# Patient Record
Sex: Male | Born: 1985
Health system: Southern US, Community
[De-identification: ages and names within clinical notes are randomized; demographics above are authoritative.]

---
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
## 2011-05-24 ENCOUNTER — Encounter (HOSPITAL_COMMUNITY): Payer: Self-pay | Admitting: Emergency Medicine

## 2011-05-24 ENCOUNTER — Emergency Department (HOSPITAL_COMMUNITY)
Admission: EM | Admit: 2011-05-24 | Discharge: 2011-05-24 | Disposition: A | Discharge status: Home / Self Care | Payer: 59 | Attending: Emergency Medicine | Admitting: Emergency Medicine

## 2011-05-24 DIAGNOSIS — M542 Cervicalgia: Secondary | ICD-10-CM | POA: Insufficient documentation

## 2011-05-24 DIAGNOSIS — R061 Stridor: Secondary | ICD-10-CM | POA: Insufficient documentation

## 2011-05-24 DIAGNOSIS — R51 Headache: Secondary | ICD-10-CM | POA: Insufficient documentation

## 2011-05-24 DIAGNOSIS — K0889 Other specified disorders of teeth and supporting structures: Secondary | ICD-10-CM

## 2011-05-24 DIAGNOSIS — K089 Disorder of teeth and supporting structures, unspecified: Secondary | ICD-10-CM | POA: Insufficient documentation

## 2011-05-24 MED ORDER — DEXAMETHASONE SODIUM PHOSPHATE 10 MG/ML IJ SOLN
10.0000 mg | Freq: Once | INTRAMUSCULAR | Status: AC
  Administered 2011-05-24: 10 mg via INTRAMUSCULAR
  Filled 2011-05-24: qty 1

## 2011-05-24 MED ORDER — HYDROMORPHONE HCL PF 1 MG/ML IJ SOLN
1.0000 mg | Freq: Once | INTRAMUSCULAR | Status: AC
  Administered 2011-05-24: 1 mg via INTRAMUSCULAR
  Filled 2011-05-24: qty 1

## 2011-05-24 NOTE — ED Notes (Signed)
Pt c/o mouth pain post tooth extraction on Thursday that is unrelieved by rx'd ketorolac and vicodin.

## 2011-05-24 NOTE — ED Provider Notes (Signed)
History     CSN: [id number]  Arrival date & time 05/24/11  0133   First MD Initiated Contact with Patient 05/24/11 [phone]      Chief Complaint  Patient presents with  . Dental Pain    post tooth extraction     HPI  History provided by the patient. Patient reports having right lower wisdom tooth extraction last Thursday. Patient reports having some continued pain and soreness. Tonight pain was much more severe and radiated to the right side of the face. Patient also reports some soreness to the right neck. Patient has taken his home hydrocodone pain medicine without significant relief. Patient denies having any associated fever, chills, sweats, nausea or vomiting. Patient did have nausea yesterday but has been eating and drinking normally. Symptoms are described as severe. Patient denies any other aggravating or alleviating factors.    History reviewed. No pertinent past medical history.  History reviewed. No pertinent past surgical history.  No family history on file.  History  Substance Use Topics  . Smoking status: Former Games developer  . Smokeless tobacco: Not on file  . Alcohol Use: No      Review of Systems  Constitutional: Negative for fever and chills.  HENT: Positive for dental problem. Negative for sore throat.   Respiratory: Negative for cough and shortness of breath.   Gastrointestinal: Negative for vomiting and abdominal pain.  Neurological: Positive for headaches. Negative for weakness and numbness.    Allergies  Review of patient's allergies indicates no known allergies.  Home Medications   Current Outpatient Rx  Name Route Sig Dispense Refill  . HYDROCODONE-ACETAMINOPHEN 7.5-325 MG PO TABS Oral Take 1 tablet by mouth every 6 (six) hours as needed.    Marland Kitchen KETOROLAC TROMETHAMINE 10 MG PO TABS Oral Take 10 mg by mouth every 6 (six) hours as needed.      BP 133/87  Pulse 61  Temp(Src) 98.5 F (36.9 C) (Oral)  Resp 18  SpO2 100%  Physical Exam  Nursing  note and vitals reviewed. Constitutional: He is oriented to person, place, and time. He appears well-developed and well-nourished. No distress.  HENT:  Head: Normocephalic and atraumatic.       Evidence of recent traction to right lower wisdom tooth. Suture comes in place and intact. No bleeding or swelling. There is tenderness to the area in jaw. No trismus. No swelling of the tongue.  Eyes: Conjunctivae and EOM are normal. Pupils are equal, round, and reactive to light.  Cardiovascular: Normal rate and regular rhythm.   Pulmonary/Chest: Effort normal and breath sounds normal. Stridor present.  Abdominal: Soft. He exhibits no distension. There is no tenderness.  Lymphadenopathy:    He has no cervical adenopathy.  Neurological: He is alert and oriented to person, place, and time. No cranial nerve deficit.  Skin: Skin is warm. No rash noted.  Psychiatric: He has a normal mood and affect. His behavior is normal.    ED Course  Procedures       1. Pain, dental       MDM  Patient seen and evaluated. Patient no acute distress. he is sleeping upon arrival to room. Evidence of Rt lower wisdom tooth extraction.  Pt was seen by Dr. Leretha Pol, PA 05/24/11 [phone]

## 2011-05-24 NOTE — ED Notes (Signed)
Pt c/o pain to R jaw, pt states he molar removed 2 days ago, now pain worse

## 2011-05-24 NOTE — ED Provider Notes (Signed)
Medical screening examination/treatment/procedure(s) were performed by non-physician practitioner and as supervising physician I was immediately available for consultation/collaboration.  Jasmine Awe, MD 05/24/11 2318

## 2011-05-24 NOTE — Discharge Instructions (Signed)
Please follow up with your dentist and oral surgeon on Monday.  Return for worsening symptoms, fever, chills, nausea or vomiting.  Dental Pain A tooth ache may be caused by cavities (tooth decay). Cavities expose the nerve of the tooth to air and hot or cold temperatures. It may come from an infection or abscess (also called a boil or furuncle) around your tooth. It is also often caused by dental caries (tooth decay). This causes the pain you are having. DIAGNOSIS  Your caregiver can diagnose this problem by exam. TREATMENT   If caused by an infection, it may be treated with medications which kill germs (antibiotics) and pain medications as prescribed by your caregiver. Take medications as directed.   Only take over-the-counter or prescription medicines for pain, discomfort, or fever as directed by your caregiver.   Whether the tooth ache today is caused by infection or dental disease, you should see your dentist as soon as possible for further care.  SEEK MEDICAL CARE IF: The exam and treatment you received today has been provided on an emergency basis only. This is not a substitute for complete medical or dental care. If your problem worsens or new problems (symptoms) appear, and you are unable to meet with your dentist, call or return to this location. SEEK IMMEDIATE MEDICAL CARE IF:   You have a fever.   You develop redness and swelling of your face, jaw, or neck.   You are unable to open your mouth.   You have severe pain uncontrolled by pain medicine.  MAKE SURE YOU:   Understand these instructions.   Will watch your condition.   Will get help right away if you are not doing well or get worse.  Document Released: 01/26/2005 Document Revised: 01/15/2011 Document Reviewed: 09/14/2007 [facility] Patient Information 2012 [place], [place].

## 2011-05-24 NOTE — ED Notes (Signed)
Pt experiencing pain to R face, jaw and back of head since tooth extraction 2 days ago. Nausea yesterday

## 2011-10-03 ENCOUNTER — Emergency Department (HOSPITAL_COMMUNITY): Payer: 59

## 2011-10-03 ENCOUNTER — Encounter (HOSPITAL_COMMUNITY): Payer: Self-pay | Admitting: Family Medicine

## 2011-10-03 ENCOUNTER — Emergency Department (HOSPITAL_COMMUNITY)
Admission: EM | Admit: 2011-10-03 | Discharge: 2011-10-03 | Disposition: A | Discharge status: Home / Self Care | Payer: 59 | Attending: Emergency Medicine | Admitting: Emergency Medicine

## 2011-10-03 DIAGNOSIS — M545 Low back pain, unspecified: Secondary | ICD-10-CM | POA: Insufficient documentation

## 2011-10-03 DIAGNOSIS — M79609 Pain in unspecified limb: Secondary | ICD-10-CM | POA: Insufficient documentation

## 2011-10-03 DIAGNOSIS — IMO0001 Reserved for inherently not codable concepts without codable children: Secondary | ICD-10-CM | POA: Insufficient documentation

## 2011-10-03 DIAGNOSIS — M25539 Pain in unspecified wrist: Secondary | ICD-10-CM | POA: Insufficient documentation

## 2011-10-03 DIAGNOSIS — Z87891 Personal history of nicotine dependence: Secondary | ICD-10-CM | POA: Insufficient documentation

## 2011-10-03 MED ORDER — IBUPROFEN 400 MG PO TABS
800.0000 mg | ORAL_TABLET | Freq: Once | ORAL | Status: AC
  Administered 2011-10-03: 800 mg via ORAL
  Filled 2011-10-03: qty 2
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
## 2011-10-03 NOTE — ED Notes (Signed)
Patient transported to X-ray 
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
## 2012-01-05 ENCOUNTER — Emergency Department (HOSPITAL_COMMUNITY): Payer: Worker's Compensation

## 2012-01-05 ENCOUNTER — Emergency Department (HOSPITAL_COMMUNITY)
Admission: EM | Admit: 2012-01-05 | Discharge: 2012-01-06 | Disposition: A | Discharge status: Home / Self Care | Payer: Worker's Compensation | Attending: Emergency Medicine | Admitting: Emergency Medicine

## 2012-01-05 DIAGNOSIS — S6010XA Contusion of unspecified finger with damage to nail, initial encounter: Secondary | ICD-10-CM

## 2012-01-05 DIAGNOSIS — S62639A Displaced fracture of distal phalanx of unspecified finger, initial encounter for closed fracture: Secondary | ICD-10-CM

## 2012-01-05 DIAGNOSIS — Z87891 Personal history of nicotine dependence: Secondary | ICD-10-CM | POA: Insufficient documentation

## 2012-01-05 DIAGNOSIS — S6000XA Contusion of unspecified finger without damage to nail, initial encounter: Secondary | ICD-10-CM | POA: Insufficient documentation

## 2012-01-05 DIAGNOSIS — Y9389 Activity, other specified: Secondary | ICD-10-CM | POA: Insufficient documentation

## 2012-01-05 DIAGNOSIS — W230XXA Caught, crushed, jammed, or pinched between moving objects, initial encounter: Secondary | ICD-10-CM | POA: Insufficient documentation

## 2012-01-05 DIAGNOSIS — Y929 Unspecified place or not applicable: Secondary | ICD-10-CM | POA: Insufficient documentation

## 2012-01-05 NOTE — ED Provider Notes (Signed)
History     CSN: [id number]  Arrival date & time 01/05/12  2316   First MD Initiated Contact with Patient 01/05/12 2317      Chief Complaint  Patient presents with  . Finger Injury    (Consider location/radiation/quality/duration/timing/severity/associated sxs/prior treatment) HPI History provided by patient.  At work around 1 AM last night and smashed his finger between a steel cart and heavy box sustaining injury to his distal left fourth finger and somewhat to his distal left fifth finger. No abrasion or laceration or bleeding. Today he has pain in that area with hematoma under his left fourth nailbed.  He has difficulty flexing his distal left fourth digit at the PIP but no other weakness or numbness otherwise. Pain is sharp in quality nonradiating. Moderate in severity. He declines any pain medications at this time. No other pain injury or trauma. No past medical history on file.  No past surgical history on file.  No family history on file.  History  Substance Use Topics  . Smoking status: Former Games developer  . Smokeless tobacco: Not on file  . Alcohol Use: No      Review of Systems  Constitutional: Negative for fever and chills.  Respiratory: Negative for shortness of breath.   Cardiovascular: Negative for chest pain.  Gastrointestinal: Negative for abdominal pain.  Musculoskeletal: Negative for back pain.  Skin: Negative for rash.  Neurological: Negative for headaches.  All other systems reviewed and are negative.    Allergies  Review of patient's allergies indicates no known allergies.  Home Medications   Current Outpatient Rx  Name  Route  Sig  Dispense  Refill  . IBUPROFEN 200 MG PO TABS   Oral   Take 400 mg by mouth every 6 (six) hours as needed. For pain           BP 133/87  Pulse 55  Temp 97.8 F (36.6 C) (Oral)  Resp 16  SpO2 98%  Physical Exam  Constitutional: He is oriented to person, place, and time. He appears well-developed and  well-nourished.  HENT:  Head: Normocephalic and atraumatic.  Eyes: EOM are normal. Pupils are equal, round, and reactive to light.  Neck: Neck supple.  Cardiovascular: Regular rhythm and intact distal pulses.   Pulmonary/Chest: Effort normal. No respiratory distress.  Musculoskeletal:       Left hand with tenderness over the DIP and distal digit left fourth finger with complete subungual hematoma. Decreased range of motion at the DIPs the patient relates secondary to pain. No obvious deformity. Skin intact throughout. Mild tenderness over the distal left fifth digit without subungual hematoma. Without deformity. No pain or tenderness otherwise  Neurological: He is alert and oriented to person, place, and time.  Skin: Skin is warm and dry.    ED Course  NERVE BLOCK Date/Time: 01/06/2012 1:25 AM Performed by: Sunnie Nielsen Authorized by: Sunnie Nielsen Consent: Verbal consent obtained. Risks and benefits: risks, benefits and alternatives were discussed Consent given by: patient Patient understanding: patient states understanding of the procedure being performed Patient consent: the patient's understanding of the procedure matches consent given Procedure consent: procedure consent matches procedure scheduled Required items: required blood products, implants, devices, and special equipment available Patient identity confirmed: verbally with patient Time out: Immediately prior to procedure a "time out" was called to verify the correct patient, procedure, equipment, support staff and site/side marked as required. Indications comments: Sub-ungual hematoma evacuation and pain relief Nerve block body site: Fourth digit. Laterality: left Preparation:  Patient was prepped and draped in the usual sterile fashion. Patient position: sitting Needle gauge: 25 G Location technique: anatomical landmarks Local anesthetic: lidocaine 1% without epinephrine Anesthetic total: 2 ml Outcome: pain  improved Patient tolerance: Patient tolerated the procedure well with no immediate complications.  PROCEDURE:  SUBUNGUAL HEMATOMA EVACUATION. 1:27 AM  Consent obtained. Timeout performed. After digital block as above, 18-gauge needle used to drain nail of left fourth digit. Blood evacuated and the patient tolerated this well.  PROCEDURE: Splint application.1:28 AM Consent obtained. Timeout performed. After digital block and subungual hematoma evacuation as above, finger splint was placed to left fourth digit indication distal tuft fracture. Fourth and fifth digits were taped together. Splint precautions provided and stated as understood. Patient tolerated this well.   Dg Hand Complete Left  01/06/2012  *RADIOLOGY REPORT*  Clinical Data: Pain in the fourth and fifth distal fingers related to the recent traumatic injury.  LEFT HAND - COMPLETE 3+ VIEW  Comparison: 01/05/2012 at 06:23 p.m.  Findings: Three views of the left hand again demonstrate a subtle nondisplaced tuft fracture through the distal phalanx of the fourth digit.  No other acute displaced fracture, subluxation or dislocation is noted.  IMPRESSION: 1.  Nondisplaced tuft fracture of the fourth distal phalanx redemonstrated.   Original Report Authenticated By: Trudie Reed, M.D.    Ice. Declines pain meds. X-ray obtained and reviewed as above. Evacuation of left fourth subungual hematoma.  Hand referral. Keflex provided. This is now an open wound. Pain medications provided. Referral to occupational medicine as needed for return to work. MDM   Left fourth distal tuft fracture after crush injury at work. Has associated subungual hematoma drained as above. Splint provided. X-rays reviewed as above. Hand referral. Occupational med referral. Vital signs and nursing notes reviewed. Patient released back to work with limitations -no use of left hand until cleared by Dr. Merlyn Lot on call for hand       Sunnie Nielsen, MD 01/06/12 [phone]

## 2012-01-05 NOTE — ED Notes (Signed)
Pt got 4th and 5th fingers on L hand caught between some steel and a box. Injury worse to 4th finger on L hand. Injury happened at 0100 this morning. Pt has bruising and swelling 4th finger on L hand. ROM intact.

## 2012-01-06 MED ORDER — IBUPROFEN 800 MG PO TABS: 800.0000 mg | ORAL_TABLET | Freq: Three times a day (TID) | ORAL | Status: DC

## 2012-01-06 MED ORDER — CEPHALEXIN 500 MG PO CAPS: 500.0000 mg | ORAL_CAPSULE | Freq: Four times a day (QID) | ORAL | Status: DC

## 2012-01-06 NOTE — ED Notes (Signed)
Ice pack applied to L hand

## 2012-07-27 ENCOUNTER — Emergency Department (HOSPITAL_COMMUNITY)
Admission: EM | Admit: 2012-07-27 | Discharge: 2012-07-27 | Disposition: A | Discharge status: Home / Self Care | Payer: No Typology Code available for payment source | Attending: Emergency Medicine | Admitting: Emergency Medicine

## 2012-07-27 DIAGNOSIS — S8990XA Unspecified injury of unspecified lower leg, initial encounter: Secondary | ICD-10-CM | POA: Insufficient documentation

## 2012-07-27 DIAGNOSIS — Y92410 Unspecified street and highway as the place of occurrence of the external cause: Secondary | ICD-10-CM | POA: Diagnosis not present

## 2012-07-27 DIAGNOSIS — S59919A Unspecified injury of unspecified forearm, initial encounter: Secondary | ICD-10-CM | POA: Diagnosis not present

## 2012-07-27 DIAGNOSIS — S99929A Unspecified injury of unspecified foot, initial encounter: Secondary | ICD-10-CM | POA: Insufficient documentation

## 2012-07-27 DIAGNOSIS — S0990XA Unspecified injury of head, initial encounter: Secondary | ICD-10-CM | POA: Diagnosis present

## 2012-07-27 DIAGNOSIS — Z87891 Personal history of nicotine dependence: Secondary | ICD-10-CM | POA: Insufficient documentation

## 2012-07-27 DIAGNOSIS — Y9389 Activity, other specified: Secondary | ICD-10-CM | POA: Insufficient documentation

## 2012-07-27 DIAGNOSIS — S59909A Unspecified injury of unspecified elbow, initial encounter: Secondary | ICD-10-CM | POA: Insufficient documentation

## 2012-07-27 DIAGNOSIS — S6990XA Unspecified injury of unspecified wrist, hand and finger(s), initial encounter: Secondary | ICD-10-CM | POA: Insufficient documentation

## 2012-07-27 MED ORDER — NAPROXEN 500 MG PO TABS: 500.0000 mg | ORAL_TABLET | Freq: Two times a day (BID) | ORAL | Status: DC

## 2012-07-27 MED ORDER — HYDROCODONE-ACETAMINOPHEN 5-325 MG PO TABS: 2.0000 | ORAL_TABLET | ORAL | Status: DC | PRN
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
## 2012-07-28 NOTE — ED Provider Notes (Signed)
Medical screening examination/treatment/procedure(s) were performed by non-physician practitioner and as supervising physician I was immediately available for consultation/collaboration.    Vida Roller, MD 07/28/12 [phone]

## 2012-09-13 ENCOUNTER — Emergency Department (HOSPITAL_COMMUNITY)
Admission: EM | Admit: 2012-09-13 | Discharge: 2012-09-14 | Disposition: A | Discharge status: Home / Self Care | Payer: BC Managed Care – PPO | Attending: Emergency Medicine | Admitting: Emergency Medicine

## 2012-09-13 ENCOUNTER — Encounter (HOSPITAL_COMMUNITY): Payer: Self-pay

## 2012-09-13 DIAGNOSIS — Y929 Unspecified place or not applicable: Secondary | ICD-10-CM | POA: Insufficient documentation

## 2012-09-13 DIAGNOSIS — Z87891 Personal history of nicotine dependence: Secondary | ICD-10-CM | POA: Insufficient documentation

## 2012-09-13 DIAGNOSIS — T6391XA Toxic effect of contact with unspecified venomous animal, accidental (unintentional), initial encounter: Secondary | ICD-10-CM | POA: Insufficient documentation

## 2012-09-13 DIAGNOSIS — R220 Localized swelling, mass and lump, head: Secondary | ICD-10-CM | POA: Insufficient documentation

## 2012-09-13 DIAGNOSIS — Y9389 Activity, other specified: Secondary | ICD-10-CM | POA: Insufficient documentation

## 2012-09-13 DIAGNOSIS — IMO0002 Reserved for concepts with insufficient information to code with codable children: Secondary | ICD-10-CM | POA: Insufficient documentation

## 2012-09-13 DIAGNOSIS — T63461A Toxic effect of venom of wasps, accidental (unintentional), initial encounter: Secondary | ICD-10-CM | POA: Insufficient documentation

## 2012-09-13 DIAGNOSIS — H5789 Other specified disorders of eye and adnexa: Secondary | ICD-10-CM | POA: Insufficient documentation

## 2012-09-13 DIAGNOSIS — Z79899 Other long term (current) drug therapy: Secondary | ICD-10-CM | POA: Insufficient documentation

## 2012-09-13 MED ORDER — FAMOTIDINE IN NACL 20-0.9 MG/50ML-% IV SOLN
20.0000 mg | Freq: Once | INTRAVENOUS | Status: AC
  Administered 2012-09-13: 20 mg via INTRAVENOUS
  Filled 2012-09-13: qty 50

## 2012-09-13 MED ORDER — DIPHENHYDRAMINE HCL 50 MG/ML IJ SOLN
25.0000 mg | Freq: Once | INTRAMUSCULAR | Status: AC
  Administered 2012-09-13: 50 mg via INTRAVENOUS
  Filled 2012-09-13: qty 1

## 2012-09-13 MED ORDER — SODIUM CHLORIDE 0.9 % IV BOLUS (SEPSIS)
1000.0000 mL | Freq: Once | INTRAVENOUS | Status: AC
  Administered 2012-09-13: 1000 mL via INTRAVENOUS

## 2012-09-13 MED ORDER — CEPHALEXIN 500 MG PO CAPS
500.0000 mg | ORAL_CAPSULE | Freq: Once | ORAL | Status: AC
  Administered 2012-09-13: 500 mg via ORAL
  Filled 2012-09-13: qty 1

## 2012-09-13 MED ORDER — METHYLPREDNISOLONE SODIUM SUCC 125 MG IJ SOLR
125.0000 mg | Freq: Once | INTRAMUSCULAR | Status: AC
  Administered 2012-09-13: 125 mg via INTRAVENOUS
  Filled 2012-09-13: qty 2

## 2012-09-13 NOTE — ED Notes (Signed)
Pt was stung by a bee this morning in his face, left side of face and eye are swollen

## 2012-09-13 NOTE — ED Provider Notes (Signed)
CSN: [id number]     Arrival date & time 09/13/12  2253 History     First MD Initiated Contact with Patient 09/13/12 2303     Chief Complaint  Patient presents with  . Insect Bite   (Consider location/radiation/quality/duration/timing/severity/associated sxs/prior Treatment) The history is provided by the patient and medical records. No language interpreter was used.    Stephen Hoffman is a 27 y.o. male  with no known medical Hx presents to the Emergency Department complaining of gradual, persistent, progressively worsening swelling of the L eye after being stung by a bee at 8:30am on the cheek.  Pt has not hx of allergic reaction to bees and denies wheezing, SOB, hives, stridor or difficulty breathing, talking or swallowing. Associated symptoms include pain at the site of the sting. Pt has taken 25mg  of benadryl without relief.  Nothing makes it better and nothing  makes it worse.  Pt denies fever, chills, headache, neck pain, chest pain, SOB, abd pain, N/V/D weakness, dizziness, syncope.      History reviewed. No pertinent past medical history. History reviewed. No pertinent past surgical history. History reviewed. No pertinent family history. History  Substance Use Topics  . Smoking status: Former Games developer  . Smokeless tobacco: Not on file  . Alcohol Use: No    Review of Systems  Constitutional: Negative for fever, diaphoresis, appetite change, fatigue and unexpected weight change.  HENT: Positive for facial swelling. Negative for mouth sores and neck stiffness.   Eyes: Negative for visual disturbance.  Respiratory: Negative for cough, chest tightness, shortness of breath and wheezing.   Cardiovascular: Negative for chest pain.  Gastrointestinal: Negative for nausea, vomiting, abdominal pain, diarrhea and constipation.  Endocrine: Negative for polydipsia, polyphagia and polyuria.  Genitourinary: Negative for dysuria, urgency, frequency and hematuria.  Musculoskeletal: Negative for  back pain.  Skin: Negative for rash.  Allergic/Immunologic: Negative for immunocompromised state.  Neurological: Negative for syncope, light-headedness and headaches.  Hematological: Does not bruise/bleed easily.  Psychiatric/Behavioral: Negative for sleep disturbance. The patient is not nervous/anxious.     Allergies  Review of patient's allergies indicates no known allergies.  Home Medications   Current Outpatient Rx  Name  Route  Sig  Dispense  Refill  . cetirizine (ZYRTEC) 10 MG tablet   Oral   Take 10 mg by mouth every morning.          Marland Kitchen esomeprazole (NEXIUM) 40 MG capsule   Oral   Take 40 mg by mouth daily before breakfast.         . Multiple Vitamin (MULTIVITAMIN WITH MINERALS) TABS tablet   Oral   Take 1 tablet by mouth every morning.         . cephALEXin (KEFLEX) 500 MG capsule   Oral   Take 1 capsule (500 mg total) by mouth 4 (four) times daily.   40 capsule   0   . diphenhydrAMINE (BENADRYL) 25 MG tablet   Oral   Take 1 tablet (25 mg total) by mouth every 6 (six) hours as needed for itching (Rash).   30 tablet   0   . famotidine (PEPCID) 20 MG tablet   Oral   Take 1 tablet (20 mg total) by mouth 2 (two) times daily.   10 tablet   0   . predniSONE (DELTASONE) 20 MG tablet   Oral   Take 2 tablets (40 mg total) by mouth daily.   10 tablet   0    BP 125/62  Pulse  75  Temp(Src) 98.5 F (36.9 C) (Oral)  Resp 18  Ht 6\' 2"  (1.88 m)  Wt 255 lb (115.667 kg)  BMI 32.73 kg/m2  SpO2 100% Physical Exam  Nursing note and vitals reviewed. Constitutional: He appears well-developed and well-nourished. No distress.  Awake, alert, nontoxic appearance  HENT:  Head: Normocephalic and atraumatic.    Right Ear: Hearing, tympanic membrane, external ear and ear canal normal.  Nose: Nose normal. No mucosal edema. Right sinus exhibits no maxillary sinus tenderness and no frontal sinus tenderness. Left sinus exhibits no maxillary sinus tenderness and no  frontal sinus tenderness.  Mouth/Throat: Uvula is midline, oropharynx is clear and moist and mucous membranes are normal. Mucous membranes are not dry. No oropharyngeal exudate, posterior oropharyngeal edema, posterior oropharyngeal erythema or tonsillar abscesses.  Facial swelling of the Left cheek and eye Mild erythema of the left cheek at the site of bee sting. Stinger removed from the patient's face.  Eyes: Conjunctivae and EOM are normal. Pupils are equal, round, and reactive to light. Right eye exhibits no chemosis, no discharge and no exudate. No foreign body present in the right eye. Left eye exhibits no chemosis, no discharge and no exudate. No foreign body present in the left eye. Right conjunctiva is not injected. Right conjunctiva has no hemorrhage. Left conjunctiva is not injected. Left conjunctiva has no hemorrhage. No scleral icterus.  Neck: Normal range of motion, full passive range of motion without pain and phonation normal. Neck supple. No spinous process tenderness and no muscular tenderness present. No rigidity. Normal range of motion present.  Airway intact Phonation normal No stridor Handling secretions without difficulty Tolerating by mouth without difficulty  Cardiovascular: Normal rate, regular rhythm, normal heart sounds and intact distal pulses.   No murmur heard. Pulmonary/Chest: Effort normal and breath sounds normal. No stridor. No respiratory distress. He has no wheezes. He has no rales.  Abdominal: Soft. Bowel sounds are normal. He exhibits no distension and no mass. There is no tenderness. There is no rebound and no guarding.  Musculoskeletal: Normal range of motion. He exhibits no edema.  Neurological: He is alert.  Speech is clear and goal oriented Moves extremities without ataxia  Skin: Skin is warm and dry. He is not diaphoretic.  Psychiatric: He has a normal mood and affect.    ED Course   Procedures (including critical care time)  Labs Reviewed - No  data to display No results found. 1. Bee sting reaction, initial encounter     MDM  Jackquline Berlin presents with local reaction to bee sting.  Pt with facial swelling and pain after being stung by the bee.  Stinger removed in the ER.  Pt given benadryl, Solu-medrol and pepcid with some relief of the swelling.  Patient re-evaluated prior to dc, is hemodynamically stable, in no respiratory distress, and denies the feeling of throat closing. Pt has been advised to take OTC benadryl & return to the ED if they have a mod-severe allergic rxn (s/s including throat closing, difficulty breathing, swelling of lips face or tongue). Pt counseled on the symptoms of a local reaction from the sting.  Pt also to begin taking Keflex as the stinger was left in place for > 12 hours.  Pt is to follow up with their PCP. Pt is agreeable with plan & verbalizes understanding.   Dahlia Client Monte Bronder, PA-C 09/14/12 [phone]

## 2012-09-14 MED ORDER — FAMOTIDINE 20 MG PO TABS: 20.0000 mg | ORAL_TABLET | Freq: Two times a day (BID) | ORAL | Status: DC

## 2012-09-14 MED ORDER — CEPHALEXIN 500 MG PO CAPS: 500.0000 mg | ORAL_CAPSULE | Freq: Four times a day (QID) | ORAL | Status: DC

## 2012-09-14 MED ORDER — PREDNISONE 20 MG PO TABS: 40.0000 mg | ORAL_TABLET | Freq: Every day | ORAL | Status: DC

## 2012-09-14 MED ORDER — DIPHENHYDRAMINE HCL 25 MG PO TABS: 25.0000 mg | ORAL_TABLET | Freq: Four times a day (QID) | ORAL | Status: DC | PRN

## 2012-09-14 NOTE — ED Provider Notes (Signed)
Medical screening examination/treatment/procedure(s) were performed by non-physician practitioner and as supervising physician I was immediately available for consultation/collaboration.  Nathen Balaban M Harper Vandervoort, MD 09/14/12 0409 

## 2013-08-25 IMAGING — CR DG HAND COMPLETE 3+V*L*
3 series · 3 of 3 positions shown · non-contrast
Comparison: 01/05/2012 at [DATE] p.m.

CLINICAL DATA: Pain in the fourth and fifth distal fingers related
to the recent traumatic injury.

LEFT HAND - COMPLETE 3+ VIEW

[x hand pa left]
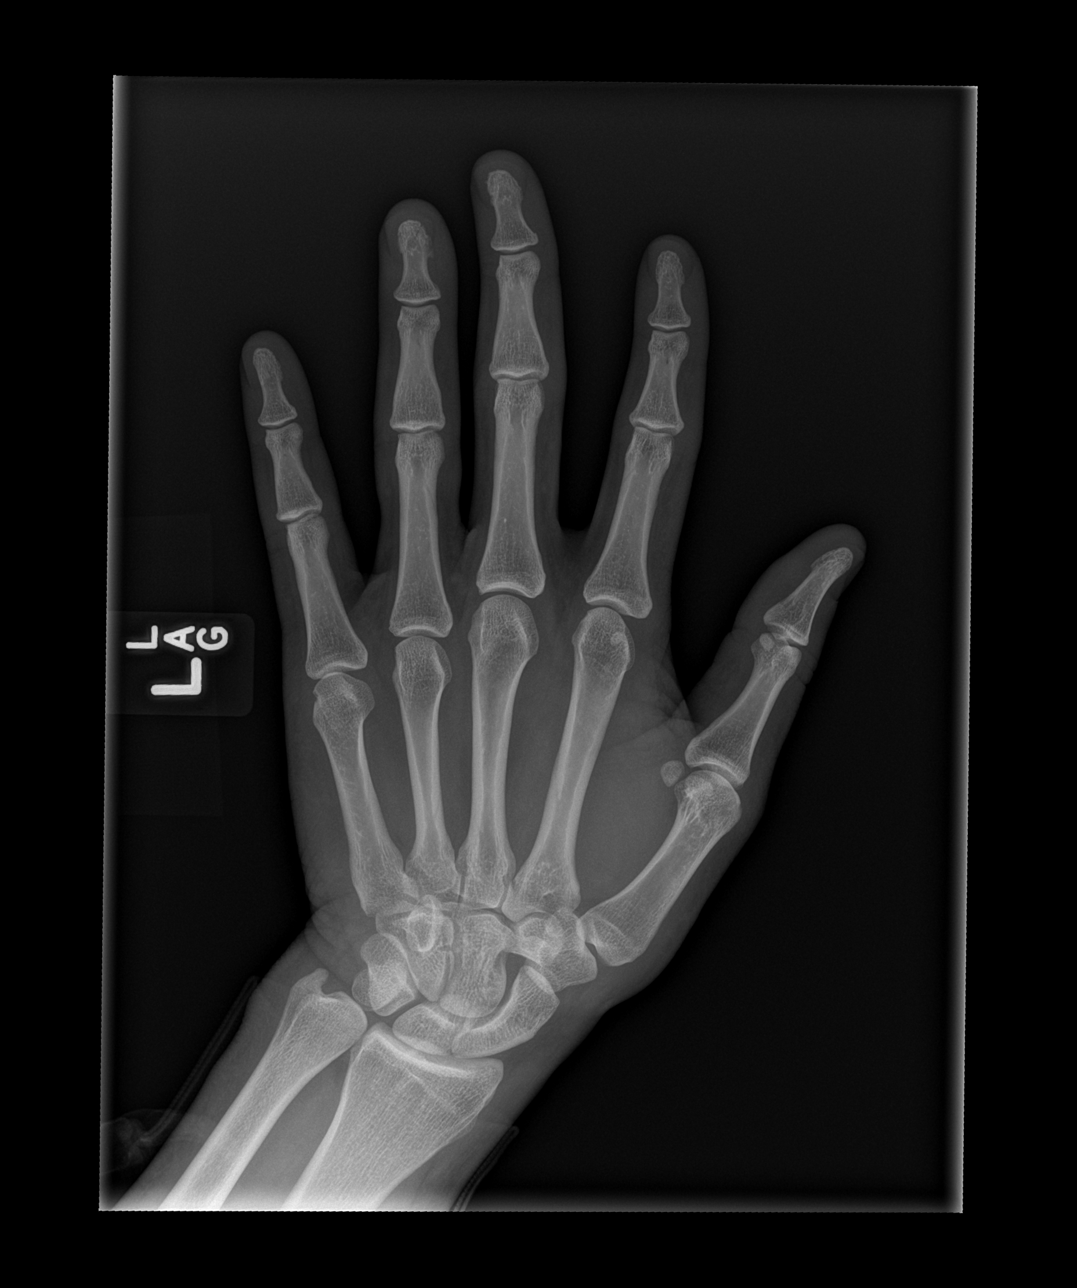

[x hand obl left]
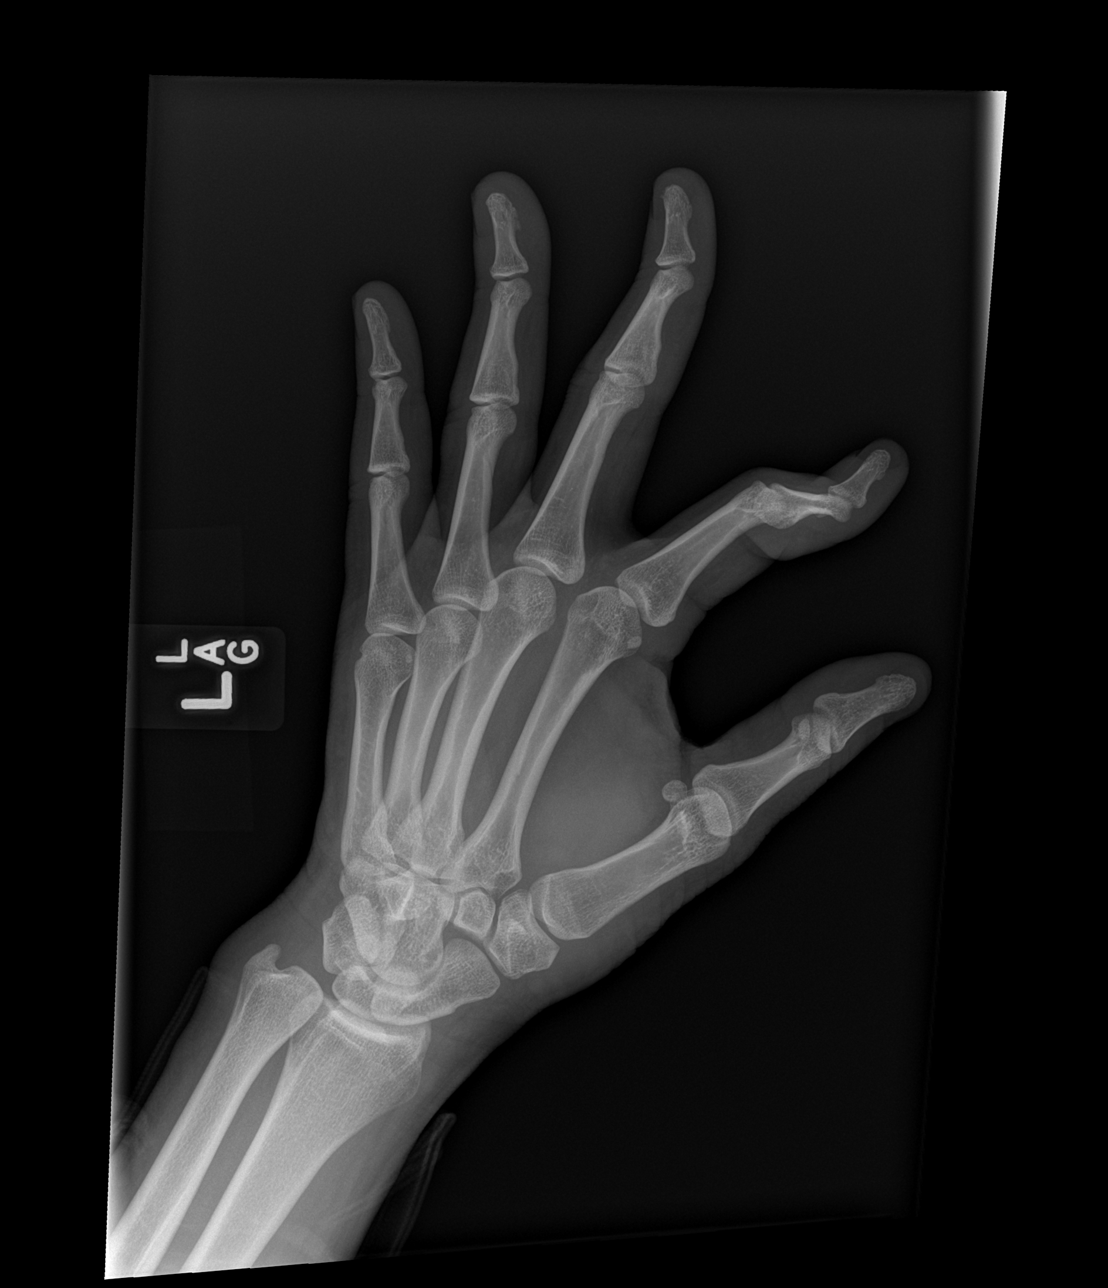

[x hand lat left]
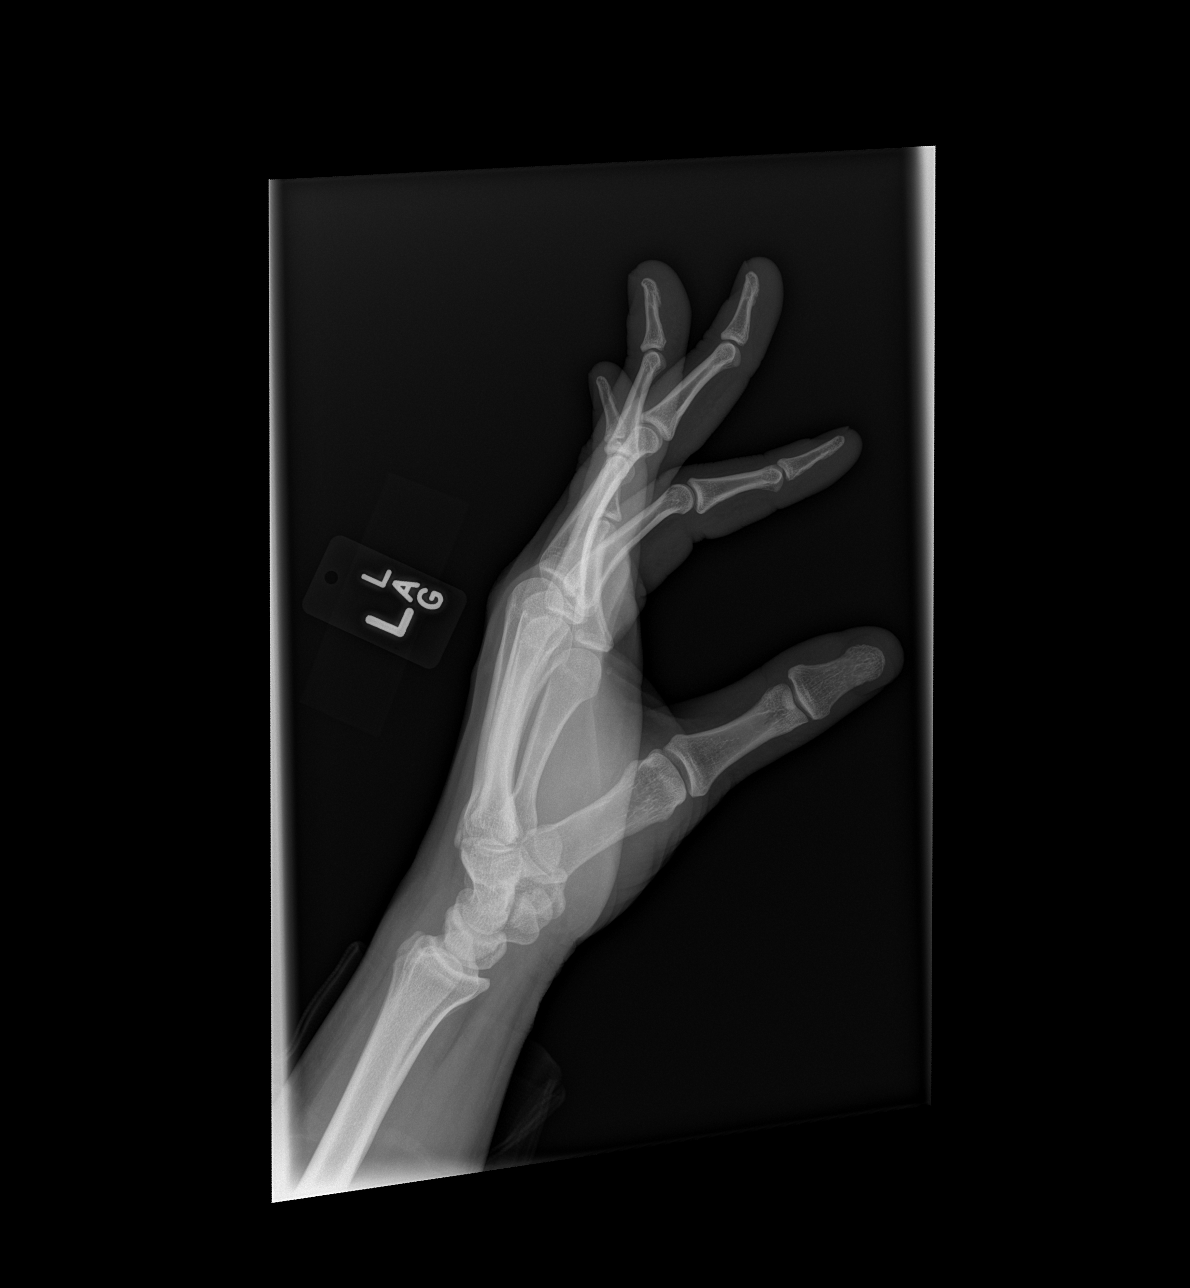

[3 of 3 positions shown; findings below may reference images not displayed]

FINDINGS: Three views of the left hand again demonstrate a subtle
nondisplaced tuft fracture through the distal phalanx of the fourth
digit.  No other acute displaced fracture, subluxation or
dislocation is noted.
IMPRESSION: 1.  Nondisplaced tuft fracture of the fourth distal phalanx
redemonstrated.

## 2017-01-21 ENCOUNTER — Ambulatory Visit (HOSPITAL_COMMUNITY)
Admission: EM | Admit: 2017-01-21 | Discharge: 2017-01-21 | Disposition: A | Discharge status: Home / Self Care | Payer: Self-pay

## 2017-01-21 ENCOUNTER — Encounter (HOSPITAL_COMMUNITY): Payer: Self-pay | Admitting: Family Medicine

## 2017-01-21 DIAGNOSIS — S161XXA Strain of muscle, fascia and tendon at neck level, initial encounter: Secondary | ICD-10-CM

## 2017-01-21 NOTE — Discharge Instructions (Signed)
Ice to the chest soreness and neck for 1-2 days then switch to heat. Motrin as needed and tylenol. May have more soreness tomorrow.
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
## 2017-07-12 MED FILL — POLYMYXIN B/TMP EYE DROPS: 10000-0.1 | 25 days supply | Qty: 10 | Fill #0

## 2017-07-13 MED FILL — PREDNISOLONE AC 1% EYE DROP: 1 | 31 days supply | Qty: 5 | Fill #0

## 2017-07-13 MED FILL — CEPHALEXIN 250 MG CAPSULE: 250 | 10 days supply | Qty: 20 | Fill #0

## 2017-09-24 MED FILL — predniSONE 20 MG TABS: 20 | 4 days supply | Qty: 6 | Fill #0

## 2017-09-28 ENCOUNTER — Other Ambulatory Visit: Payer: Self-pay

## 2017-09-28 ENCOUNTER — Encounter (HOSPITAL_COMMUNITY): Payer: Self-pay | Admitting: Emergency Medicine

## 2017-09-28 ENCOUNTER — Emergency Department (HOSPITAL_COMMUNITY)
Admission: EM | Admit: 2017-09-28 | Discharge: 2017-09-28 | Disposition: A | Discharge status: Home / Self Care | Payer: 59 | Attending: Emergency Medicine | Admitting: Emergency Medicine

## 2017-09-28 DIAGNOSIS — Z87891 Personal history of nicotine dependence: Secondary | ICD-10-CM | POA: Insufficient documentation

## 2017-09-28 DIAGNOSIS — Z79899 Other long term (current) drug therapy: Secondary | ICD-10-CM | POA: Insufficient documentation

## 2017-09-28 DIAGNOSIS — L02413 Cutaneous abscess of right upper limb: Secondary | ICD-10-CM | POA: Insufficient documentation

## 2017-09-28 DIAGNOSIS — L0291 Cutaneous abscess, unspecified: Secondary | ICD-10-CM

## 2017-09-28 MED ORDER — LIDOCAINE-EPINEPHRINE (PF) 2 %-1:200000 IJ SOLN
20.0000 mL | Freq: Once | INTRAMUSCULAR | Status: AC
  Administered 2017-09-28: 20 mL via INTRADERMAL
  Filled 2017-09-28: qty 20

## 2017-09-28 MED ORDER — CEPHALEXIN 250 MG PO CAPS
500.0000 mg | ORAL_CAPSULE | Freq: Once | ORAL | Status: AC
  Administered 2017-09-28: 500 mg via ORAL
  Filled 2017-09-28: qty 2

## 2017-09-28 MED ORDER — CEPHALEXIN 500 MG PO CAPS: 500.0000 mg | ORAL_CAPSULE | Freq: Three times a day (TID) | ORAL | 0 refills | Status: AC

## 2017-09-28 MED ORDER — DOXYCYCLINE HYCLATE 100 MG PO TABS
100.0000 mg | ORAL_TABLET | Freq: Once | ORAL | Status: AC
  Administered 2017-09-28: 100 mg via ORAL
  Filled 2017-09-28: qty 1

## 2017-09-28 MED ORDER — DOXYCYCLINE HYCLATE 100 MG PO CAPS: 100.0000 mg | ORAL_CAPSULE | Freq: Two times a day (BID) | ORAL | 0 refills | Status: DC

## 2017-09-28 NOTE — Discharge Instructions (Addendum)
It is very important that you follow-up with your regular doctor in 2 days for recheck of your wound to make sure that it is healing appropriately.  Gently soak in warm water twice a day, it will continue to ooze this is normal.  Keep covered with dry dressing.   Take both antibiotics until they are completely gone.   Return to the ER immediately if you have any new or concerning symptoms like fever, worsening redness and swelling over the wrist or redness spreading up your arm.

## 2017-09-28 NOTE — ED Triage Notes (Signed)
Pt reports having small bug bite to R wrist last week, unsure what type. The area had increased swelling and [facility] and given medication but did not finish taking them. Pt reports he removed a dressing placed on area and the wound opened. Pt has serosanguineous drainage and minimal redness to area. Pt afebrile

## 2017-09-28 NOTE — ED Provider Notes (Signed)
[facility] EMERGENCY DEPARTMENT Provider Note   CSN: [id number] Arrival date & time: 09/28/17  1840     History   Chief Complaint Chief Complaint  Patient presents with  . Insect Bite    HPI Stephen Hoffman is a 32 y.o. male.  HPI  Stephen Hoffman is a 32yo male with no significant past medical history who presents to the Emergency Department for evaluation of right wrist wound. Patient states that he thinks he had a bug bite a week ago because he was doing some yard work outside and noticed an itchy bump over the ulnar aspect of the right wrist shortly after. He states that it grew in size over the course of the week. Went to urgent care where he was prescribed prednisone. Despite prednisone, the area of swelling got bigger and today it opened and pus started draining. He has not been on antibiotics. The wound hurts with palpation. He denies fever, chills, numbness, weakness, wounds or arthralgias elsewhere.   History reviewed. No pertinent past medical history.  There are no active problems to display for this patient.   History reviewed. No pertinent surgical history.      Home Medications    Prior to Admission medications   Medication Sig Start Date End Date Taking? Authorizing Provider  cetirizine (ZYRTEC) 10 MG tablet Take 10 mg by mouth every morning.     [provider]  Multiple Vitamin (MULTIVITAMIN WITH MINERALS) TABS tablet Take 1 tablet by mouth every morning.    [provider]    Family History History reviewed. No pertinent family history.  Social History Social History   Tobacco Use  . Smoking status: Former Smoker  Substance Use Topics  . Alcohol use: No  . Drug use: Yes    Types: Marijuana    Comment: 2005     Allergies   Pork-derived products   Review of Systems Review of Systems  Constitutional: Negative for chills and fever.  Gastrointestinal: Negative for abdominal pain and vomiting.  Skin:  Positive for wound (right wrist).  Neurological: Negative for dizziness, weakness, numbness and headaches.     Physical Exam Updated Vital Signs BP (!) 143/97 (BP Location: Right Arm)   Pulse (!) 51   Temp 98.8 F (37.1 C) (Oral)   Resp 18   SpO2 100%   Physical Exam  Constitutional: He is oriented to person, place, and time. He appears well-developed and well-nourished. No distress.  Non-toxic appearing.   HENT:  Head: Normocephalic and atraumatic.  Eyes: Right eye exhibits no discharge. Left eye exhibits no discharge.  Pulmonary/Chest: Effort normal. No respiratory distress.  Musculoskeletal:  Right wrist swollen over the ulnar aspect. There is an open 1cm circular wound with thick purulent material draining. Mildly tender to the touch. Full wrist ROM without tenderness. Radial pulse 2+ bilaterally. No erythema up the wrist. Cap refill <2sec. Sensation to light touch intact in radian, median and ulnar distribution.   Neurological: He is alert and oriented to person, place, and time. Coordination normal.  Skin: Skin is warm and dry. Capillary refill takes less than 2 seconds. He is not diaphoretic.  Psychiatric: He has a normal mood and affect. His behavior is normal.  Nursing note and vitals reviewed.      ED Treatments / Results  Labs (all labs ordered are listed, but only abnormal results are displayed) Labs Reviewed - No data to display  EKG None  Radiology No results found.  Procedures .Marland KitchenIncision  and Drainage Date/Time: 09/29/2017 1:32 AM Performed by: Kellie ShropshireShrosbree, Hesston Hitchens J, PA-C Authorized by: Kellie ShropshireShrosbree, Kalika Smay J, PA-C   Consent:    Consent obtained:  Verbal   Consent given by:  Patient   Risks discussed:  Incomplete drainage, bleeding and pain   Alternatives discussed:  No treatment and delayed treatment Location:    Type:  Abscess   Size:  1cm   Location:  Upper extremity   Upper extremity location:  Wrist   Wrist location:  R wrist Pre-procedure  details:    Skin preparation:  Betadine Anesthesia (see MAR for exact dosages):    Anesthesia method:  Local infiltration   Local anesthetic:  Lidocaine 2% WITH epi Procedure type:    Complexity:  Simple Procedure details:    Incision type: Wound already open, no incision necessary.   Wound management:  Irrigated with saline, probed and deloculated and extensive cleaning   Drainage:  Purulent and bloody   Drainage amount:  Moderate   Wound treatment:  Wound left open   Packing materials:  None Post-procedure details:    Patient tolerance of procedure:  Tolerated well, no immediate complications   (including critical care time)  EMERGENCY DEPARTMENT US SOFT TISSUE INTERPRETATION "Study: Limited Soft Tissue Ultrasound"  INDICATIONS: Pain and Soft tissue infection Multiple views of the body part were obtained in real-time with a multi-frequency linear probe  PERFORMED BY: Myself IMAGES ARCHIVED?: Yes SIDE:Right  BODY PART:Upper extremity INTERPRETATION:  Abcess present and Cellulitis present  Medications Ordered in ED Medications  lidocaine-EPINEPHrine (XYLOCAINE W/EPI) 2 %-1:200000 (PF) injection 20 mL (has no administration in time range)  doxycycline (VIBRA-TABS) tablet 100 mg (100 mg Oral Given 09/28/17 2106)  cephALEXin (KEFLEX) capsule 500 mg (500 mg Oral Given 09/28/17 2106)     Initial Impression / Assessment and Plan / ED Course  I have reviewed the triage vital signs and the nursing notes.  Pertinent labs & imaging results that were available during my care of the patient were reviewed by me and considered in my medical decision making (see chart for details).     Presents with open wound in the right forearm which is draining purulent material.  Remote history of a bug bite.  No erythema, warmth or induration up the arm or towards the palm of the hand.  He is overall afebrile and nontoxic-appearing.  No history of immunocompromise.  I do not suspect systemic  infection requiring lab work.   Bedside ultrasound revealed additional purulent material in the wound space.  I used local anesthesia over the wound and was able to express additional pus from the wound site.  This was then irrigated with 1000 cc of sterile saline pressure wash.  He will be discharged home with instructions on warm soaks and dual antibiotic therapy with doxycycline and Keflex.  I have counseled him to follow-up with his PCP for recheck in 2 days.  He agrees and appears reliable.  I have counseled him on strict return precautions and he also agrees.  I discussed this patient with Dr. Freida BusmanAllen who agrees with this plan.  Final Clinical Impressions(s) / ED Diagnoses   Final diagnoses:  Abscess    ED Discharge Orders         Ordered    doxycycline (VIBRAMYCIN) 100 MG capsule  2 times daily     09/28/17 2112    cephALEXin (KEFLEX) 500 MG capsule  3 times daily     09/28/17 2112  Kellie ShropshireShrosbree, [place] Ducre J, PA-C 09/29/17 [id number]    Lorre NickAllen, Anthony, MD 09/30/17 [phone]

## 2018-01-03 MED FILL — CEPHALEXIN 500 MG CAPSULE: 500 | 10 days supply | Qty: 20 | Fill #0

## 2018-02-04 MED FILL — SULFAMETHOXAZOLE-TMP DS TAB: 800-160 | 10 days supply | Qty: 20 | Fill #0

## 2018-02-16 MED FILL — MELOXICAM 15 MG TABLET: 15 | 30 days supply | Qty: 30 | Fill #0

## 2018-02-16 MED FILL — BENZONATATE 100 MG CAPS: 100 | 7 days supply | Qty: 20 | Fill #0

## 2018-02-16 MED FILL — HYDROCORTISONE 2.5% CREAM: 2.5 | 30 days supply | Qty: 30 | Fill #0

## 2018-03-21 MED FILL — OSELTAMIVIR PHOSPHATE 75 MG: 75 | 5 days supply | Qty: 10 | Fill #0

## 2018-03-21 MED FILL — ONDANSETRON ODT 8 MG TABLET: 8 | 7 days supply | Qty: 20 | Fill #0

## 2018-04-15 MED FILL — DICLOFENAC SODIUM 1 % GEL: 1 | 25 days supply | Qty: 200 | Fill #0

## 2018-10-14 ENCOUNTER — Other Ambulatory Visit: Payer: Self-pay

## 2018-10-14 DIAGNOSIS — Z20822 Contact with and (suspected) exposure to covid-19: Secondary | ICD-10-CM

## 2018-10-16 LAB — NOVEL CORONAVIRUS, NAA: SARS-CoV-2, NAA: NOT DETECTED

## 2018-12-01 ENCOUNTER — Other Ambulatory Visit: Payer: Self-pay

## 2018-12-01 DIAGNOSIS — Z20822 Contact with and (suspected) exposure to covid-19: Secondary | ICD-10-CM

## 2018-12-03 LAB — NOVEL CORONAVIRUS, NAA: SARS-CoV-2, NAA: NOT DETECTED

## 2019-02-01 ENCOUNTER — Ambulatory Visit: Payer: 59 | Attending: Internal Medicine

## 2019-02-01 DIAGNOSIS — Z20822 Contact with and (suspected) exposure to covid-19: Secondary | ICD-10-CM

## 2019-02-03 LAB — NOVEL CORONAVIRUS, NAA: SARS-CoV-2, NAA: NOT DETECTED

## 2019-03-02 ENCOUNTER — Encounter (HOSPITAL_COMMUNITY): Payer: Self-pay

## 2019-03-02 ENCOUNTER — Other Ambulatory Visit: Payer: Self-pay

## 2019-03-02 ENCOUNTER — Ambulatory Visit (HOSPITAL_COMMUNITY)
Admission: EM | Admit: 2019-03-02 | Discharge: 2019-03-02 | Disposition: A | Discharge status: Home / Self Care | Payer: 59 | Attending: Physician Assistant | Admitting: Physician Assistant

## 2019-03-02 DIAGNOSIS — L03012 Cellulitis of left finger: Secondary | ICD-10-CM

## 2019-03-02 MED ORDER — DOXYCYCLINE HYCLATE 100 MG PO CAPS: 100.0000 mg | ORAL_CAPSULE | Freq: Two times a day (BID) | ORAL | 0 refills | Status: AC

## 2019-03-02 MED ORDER — CEPHALEXIN 500 MG PO CAPS: 500.0000 mg | ORAL_CAPSULE | Freq: Four times a day (QID) | ORAL | 0 refills | Status: AC

## 2019-03-02 MED ORDER — CEPHALEXIN 500 MG PO CAPS: 500.0000 mg | ORAL_CAPSULE | Freq: Four times a day (QID) | ORAL | 0 refills | Status: DC

## 2019-03-02 MED ORDER — DOXYCYCLINE HYCLATE 100 MG PO CAPS: 100.0000 mg | ORAL_CAPSULE | Freq: Two times a day (BID) | ORAL | 0 refills | Status: DC

## 2019-03-02 NOTE — ED Provider Notes (Signed)
[facility]    CSN: [id number] Arrival date & time: 03/02/19  1511      History   Chief Complaint Chief Complaint  Patient presents with  . Hand Pain    HPI Stephen Hoffman is a 34 y.o. male.   Patient reports to urgent care today for left ring finger swelling and pain for 1 week. Reports pain is severe with movement. He believes it is infected. He has not had fever or chills. He has not taken any medications for it. There has been no drainage.       History reviewed. No pertinent past medical history.  There are no problems to display for this patient.   History reviewed. No pertinent surgical history.     Home Medications    Prior to Admission medications   Medication Sig Start Date End Date Taking? Authorizing Provider  cephALEXin (KEFLEX) 500 MG capsule Take 1 capsule (500 mg total) by mouth 4 (four) times daily. 03/02/19   Nikyah Lackman, Marguerita Beards, PA-C  cetirizine (ZYRTEC) 10 MG tablet Take 10 mg by mouth every morning.     [provider]  doxycycline (VIBRAMYCIN) 100 MG capsule Take 1 capsule (100 mg total) by mouth 2 (two) times daily for 7 days. 03/02/19 03/09/19  Yusif Gnau, Marguerita Beards, PA-C  Multiple Vitamin (MULTIVITAMIN WITH MINERALS) TABS tablet Take 1 tablet by mouth every morning.    [provider]    Family History Family History  Problem Relation Age of Onset  . Diabetes Mother   . Hypertension Mother     Social History Social History   Tobacco Use  . Smoking status: Former Research scientist (life sciences)  . Smokeless tobacco: Never Used  Substance Use Topics  . Alcohol use: No  . Drug use: Yes    Types: Marijuana    Comment: 2005     Allergies   Pork-derived products   Review of Systems Review of Systems  Constitutional: Negative for chills and fever.  Musculoskeletal: Positive for arthralgias (left ring finger).  Skin: Positive for color change and wound.     Physical Exam Triage Vital Signs ED Triage Vitals  Enc Vitals Group     BP  03/02/19 1626 (!) 158/92     Pulse Rate 03/02/19 1626 75     Resp 03/02/19 1626 16     Temp 03/02/19 1626 98.8 F (37.1 C)     Temp Source 03/02/19 1626 Oral     SpO2 03/02/19 1626 100 %     Weight --      Height --      Head Circumference --      Peak Flow --      Pain Score 03/02/19 1623 7     Pain Loc --      Pain Edu? --      Excl. in [place]? --    No data found.  Updated Vital Signs BP (!) 158/92 (BP Location: Right Arm)   Pulse 75   Temp 98.8 F (37.1 C) (Oral)   Resp 16   SpO2 100%   Visual Acuity Right Eye Distance:   Left Eye Distance:   Bilateral Distance:    Right Eye Near:   Left Eye Near:    Bilateral Near:     Physical Exam Vitals and nursing note reviewed.  Constitutional:      General: He is not in acute distress.    Appearance: Normal appearance. He is well-developed. He is not ill-appearing.  HENT:  Head: Normocephalic and atraumatic.  Eyes:     Conjunctiva/sclera: Conjunctivae normal.  Cardiovascular:     Rate and Rhythm: Normal rate.  Pulmonary:     Effort: Pulmonary effort is normal. No respiratory distress.  Musculoskeletal:     Right hand: Normal.     Left hand: Swelling and tenderness present. Decreased range of motion.     Cervical back: Neck supple.     Comments: Left 4th finger with swelling, erythema and evidence of paronychia. No drainage currently.   Skin:    General: Skin is warm and dry.  Neurological:     General: No focal deficit present.     Mental Status: He is alert and oriented to person, place, and time.  Psychiatric:        Mood and Affect: Mood normal.        Behavior: Behavior normal.        Thought Content: Thought content normal.        Judgment: Judgment normal.      UC Treatments / Results  Labs (all labs ordered are listed, but only abnormal results are displayed) Labs Reviewed - No data to display  EKG   Radiology No results found.  Procedures Incision and Drainage  Date/Time: 03/02/2019  5:28 PM Performed by: Hermelinda Medicus, PA-C Authorized by: Hermelinda Medicus, PA-C   Consent:    Consent obtained:  Verbal   Consent given by:  Patient   Risks discussed:  Incomplete drainage, bleeding and infection   Alternatives discussed:  No treatment Location:    Type:  Abscess   Location:  Upper extremity   Upper extremity location:  Finger   Finger location:  L ring finger Pre-procedure details:    Skin preparation:  Chloraprep and antiseptic wash Anesthesia (see MAR for exact dosages):    Anesthesia method:  Topical application Procedure type:    Complexity:  Simple Procedure details:    Needle aspiration: no     Incision types:  Stab incision   Incision depth:  Dermal   Scalpel blade:  11   Drainage:  Bloody and purulent   Drainage amount:  Moderate   Wound treatment:  Wound left open   Packing materials:  None Post-procedure details:    Patient tolerance of procedure:  Tolerated well, no immediate complications   (including critical care time)  Medications Ordered in UC Medications - No data to display  Initial Impression / Assessment and Plan / UC Course  I have reviewed the triage vital signs and the nursing notes.  Pertinent labs & imaging results that were available during my care of the patient were reviewed by me and considered in my medical decision making (see chart for details).     #paronychia - stab incision with drainage. Doxy and keflex sent. Instructed to monitor swelling on bottom of finger. Do not believe felon has developed at this time and swelling is 2/2 paronychia. Follow up precautions discussed.   Final Clinical Impressions(s) / UC Diagnoses   Final diagnoses:  Paronychia of finger of left hand     Discharge Instructions     Massage the area daily, some mild discharge can be expected for a day or so.  Take the antibiotics as prescribed  Keep current bandage in place for 24 hours and then begin cleaning daily. You may use gauze and  tape or a large bandaid to cover the wound site  If swelling worsens or does not improve, come back to be  re-evaluated or follow up with your primary care.      ED Prescriptions    Medication Sig Dispense Auth. Provider   cephALEXin (KEFLEX) 500 MG capsule  (Status: Discontinued) Take 1 capsule (500 mg total) by mouth 4 (four) times daily. 20 capsule Biridiana Twardowski, Veryl Speak, PA-C   doxycycline (VIBRAMYCIN) 100 MG capsule  (Status: Discontinued) Take 1 capsule (100 mg total) by mouth 2 (two) times daily for 7 days. 14 capsule Imya Mance, Veryl Speak, PA-C   cephALEXin (KEFLEX) 500 MG capsule Take 1 capsule (500 mg total) by mouth 4 (four) times daily. 20 capsule Cavion Faiola, Veryl Speak, PA-C   doxycycline (VIBRAMYCIN) 100 MG capsule Take 1 capsule (100 mg total) by mouth 2 (two) times daily for 7 days. 14 capsule Filomeno Cromley, Veryl Speak, PA-C     PDMP not reviewed this encounter.   Hermelinda Medicus, PA-C 03/03/19 [phone]

## 2019-03-02 NOTE — Discharge Instructions (Signed)
Massage the area daily, some mild discharge can be expected for a day or so.  Take the antibiotics as prescribed  Keep current bandage in place for 24 hours and then begin cleaning daily. You may use gauze and tape or a large bandaid to cover the wound site  If swelling worsens or does not improve, come back to be re-evaluated or follow up with your primary care.

## 2019-03-02 NOTE — ED Triage Notes (Signed)
Patient presents to Urgent Care with complaints of left hand pain on his ring finger since a few days ago. Patient reports unknown injury, hurts most around the edge of his fingernail. Some swelling noted.

## 2019-12-15 ENCOUNTER — Emergency Department (HOSPITAL_COMMUNITY)
Admission: EM | Admit: 2019-12-15 | Discharge: 2019-12-15 | Disposition: A | Discharge status: Home / Self Care | Payer: 59 | Attending: Emergency Medicine | Admitting: Emergency Medicine

## 2019-12-15 ENCOUNTER — Other Ambulatory Visit: Payer: Self-pay

## 2019-12-15 ENCOUNTER — Encounter (HOSPITAL_COMMUNITY): Payer: Self-pay | Admitting: Emergency Medicine

## 2019-12-15 DIAGNOSIS — Y92410 Unspecified street and highway as the place of occurrence of the external cause: Secondary | ICD-10-CM | POA: Insufficient documentation

## 2019-12-15 DIAGNOSIS — Y9389 Activity, other specified: Secondary | ICD-10-CM | POA: Insufficient documentation

## 2019-12-15 DIAGNOSIS — Z87891 Personal history of nicotine dependence: Secondary | ICD-10-CM | POA: Diagnosis not present

## 2019-12-15 DIAGNOSIS — M25532 Pain in left wrist: Secondary | ICD-10-CM | POA: Insufficient documentation
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
## 2019-12-15 NOTE — ED Notes (Signed)
E-signature pad unavailable at time of pt discharge. This RN discussed discharge materials with pt and answered all pt questions. Pt stated understanding of discharge material. ? ?

## 2019-12-15 NOTE — Discharge Instructions (Addendum)
Take ibuprofen 3 times a day with meals as needed for pain.  Do not take other anti-inflammatories at the same time (Advil, Motrin, naproxen, Aleve). You may supplement with Tylenol if you need further pain control. Use muscle creams such as Salonpas, icy hot, BenGay, Biofreeze as needed for pain control. Use ice packs or heating pads if this helps control your pain. You will likely have continued muscle stiffness and soreness over the next couple days.  Follow-up with primary care in 1 week if your symptoms are not improving. Return to the emergency room if you develop vision changes, vomiting, slurred speech, numbness, loss of bowel or bladder control, or any new or worsening symptoms.
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
## 2020-02-05 ENCOUNTER — Ambulatory Visit (HOSPITAL_COMMUNITY)
Admission: EM | Admit: 2020-02-05 | Discharge: 2020-02-05 | Disposition: A | Discharge status: Home / Self Care | Payer: 59 | Attending: Student | Admitting: Student

## 2020-02-05 ENCOUNTER — Encounter (HOSPITAL_COMMUNITY): Payer: Self-pay

## 2020-02-05 ENCOUNTER — Other Ambulatory Visit: Payer: Self-pay

## 2020-02-05 DIAGNOSIS — R197 Diarrhea, unspecified: Secondary | ICD-10-CM

## 2020-02-05 DIAGNOSIS — R112 Nausea with vomiting, unspecified: Secondary | ICD-10-CM | POA: Diagnosis not present

## 2020-02-05 MED ORDER — LOPERAMIDE HCL 2 MG PO CAPS: 2.0000 mg | ORAL_CAPSULE | Freq: Four times a day (QID) | ORAL | 0 refills | Status: AC | PRN

## 2020-02-05 MED ORDER — ONDANSETRON HCL 8 MG PO TABS: 8.0000 mg | ORAL_TABLET | Freq: Three times a day (TID) | ORAL | 0 refills | Status: AC | PRN

## 2020-02-05 NOTE — ED Triage Notes (Signed)
Pt in with c/o vomiting and diarrhea that just started today. States that he isn't able to keep fluid or food down. Also c/o headache  Pt has not meds for sxs  Denies any fever

## 2020-02-05 NOTE — Discharge Instructions (Addendum)
-  Use the zofran as needed for nausea.  -Use the immodium as needed for diarrhea.   Drink plenty of fluids and try eating bland foods when you feel less nauseous.

## 2020-02-05 NOTE — ED Provider Notes (Signed)
[facility]    CSN: [id number] Arrival date & time: 02/05/20  1641      History   Chief Complaint Chief Complaint  Patient presents with  . Vomiting  . Diarrhea    HPI Stephen Hoffman is a 34 y.o. male presenting for vomiting and diarrhea x1 day and a headache. States he has thrown up twice in the last 12 hours and had 5 episodes of diarrhea. Denies history of intractable vomiting. Also endorses 2/10 headache for few hours. Feeling well otherwise. States symptoms began for him and daughter after eating the same food at [employer]. Denies red or black stool. Denies fevers/chills, n/v/d, shortness of breath, chest pain, cough, congestion, facial pain, teeth pain, headaches, sore throat, loss of taste/smell, swollen lymph nodes, ear pain. Denies abdominal pain.   HPI  History reviewed. No pertinent past medical history.  There are no problems to display for this patient.   History reviewed. No pertinent surgical history.     Home Medications    Prior to Admission medications   Medication Sig Start Date End Date Taking? Authorizing Provider  loperamide (IMODIUM) 2 MG capsule Take 1 capsule (2 mg total) by mouth 4 (four) times daily as needed for diarrhea or loose stools. 02/05/20  Yes Rhys Martini, PA-C  ondansetron (ZOFRAN) 8 MG tablet Take 1 tablet (8 mg total) by mouth every 8 (eight) hours as needed for nausea or vomiting. 02/05/20  Yes Rhys Martini, PA-C  cephALEXin (KEFLEX) 500 MG capsule Take 1 capsule (500 mg total) by mouth 4 (four) times daily. 03/02/19   Darr, Gerilyn Pilgrim, PA-C  cetirizine (ZYRTEC) 10 MG tablet Take 10 mg by mouth every morning.     [provider]  Multiple Vitamin (MULTIVITAMIN WITH MINERALS) TABS tablet Take 1 tablet by mouth every morning.    [provider]    Family History Family History  Problem Relation Age of Onset  . Diabetes Mother   . Hypertension Mother     Social History Social History   Tobacco  Use  . Smoking status: Former Games developer  . Smokeless tobacco: Never Used  Substance Use Topics  . Alcohol use: No  . Drug use: Yes    Types: Marijuana    Comment: 2005     Allergies   Pork-derived products   Review of Systems Review of Systems  Gastrointestinal: Positive for diarrhea, nausea and vomiting.  All other systems reviewed and are negative.    Physical Exam Triage Vital Signs ED Triage Vitals  Enc Vitals Group     BP 02/05/20 1841 (!) 152/84     Pulse Rate 02/05/20 1841 75     Resp 02/05/20 1841 20     Temp 02/05/20 1841 97.9 F (36.6 C)     Temp Source 02/05/20 1841 Oral     SpO2 02/05/20 1841 99 %     Weight --      Height --      Head Circumference --      Peak Flow --      Pain Score 02/05/20 1840 2     Pain Loc --      Pain Edu? --      Excl. in [place]? --    No data found.  Updated Vital Signs BP (!) 152/84 (BP Location: Right Arm)   Pulse 75   Temp 97.9 F (36.6 C) (Oral)   Resp 20   SpO2 99%   Visual Acuity Right Eye Distance:  Left Eye Distance:   Bilateral Distance:    Right Eye Near:   Left Eye Near:    Bilateral Near:     Physical Exam Vitals reviewed.  Constitutional:      General: He is not in acute distress.    Appearance: Normal appearance. He is not ill-appearing.     Comments: Appears well hydrated  HENT:     Head: Normocephalic and atraumatic.  Cardiovascular:     Rate and Rhythm: Normal rate and regular rhythm.     Heart sounds: Normal heart sounds.  Pulmonary:     Effort: Pulmonary effort is normal.     Breath sounds: Normal breath sounds. No wheezing, rhonchi or rales.  Abdominal:     General: Bowel sounds are normal. There is no distension.     Palpations: Abdomen is soft. There is no mass.     Tenderness: There is no abdominal tenderness. There is no right CVA tenderness, left CVA tenderness, guarding or rebound. Negative signs include Murphy's sign, Rovsing's sign and McBurney's sign.     Comments: Bowel  sounds positive in all 4 quadrants. No tenderness to palpation. Negative Murphy Sign, Rovsing's sign, McBurney point tenderness.   Neurological:     General: No focal deficit present.     Mental Status: He is alert and oriented to person, place, and time.  Psychiatric:        Mood and Affect: Mood normal.        Behavior: Behavior normal.        Thought Content: Thought content normal.        Judgment: Judgment normal.      UC Treatments / Results  Labs (all labs ordered are listed, but only abnormal results are displayed) Labs Reviewed - No data to display  EKG   Radiology No results found.  Procedures Procedures (including critical care time)  Medications Ordered in UC Medications - No data to display  Initial Impression / Assessment and Plan / UC Course  I have reviewed the triage vital signs and the nursing notes.  Pertinent labs & imaging results that were available during my care of the patient were reviewed by me and considered in my medical decision making (see chart for details).     This patient has had 2 episodes of nonbilious vomiting in the last day, as well as 5 episodes of diarrhea. Last episode of vomiting was 4 hours ago. No history of intractable vomiting. Afebrile, nontachycardic, nontachypeinic. Reassurance provided. Script for zofran and immodium sent as below. BRAT diet, good hydration.  Return precautions- intractable nausea/vomiting, inability to hydrate at home, etc.   Final Clinical Impressions(s) / UC Diagnoses   Final diagnoses:  Nausea vomiting and diarrhea     Discharge Instructions     -Use the zofran as needed for nausea.  -Use the immodium as needed for diarrhea.   Drink plenty of fluids and try eating bland foods when you feel less nauseous.    ED Prescriptions    Medication Sig Dispense Auth. Provider   ondansetron (ZOFRAN) 8 MG tablet Take 1 tablet (8 mg total) by mouth every 8 (eight) hours as needed for nausea or  vomiting. 20 tablet Rhys Martini, PA-C   loperamide (IMODIUM) 2 MG capsule Take 1 capsule (2 mg total) by mouth 4 (four) times daily as needed for diarrhea or loose stools. 12 capsule Rhys Martini, PA-C     PDMP not reviewed this encounter.   Rhys Martini,  PA-C 02/05/20 1915

## 2020-02-12 ENCOUNTER — Other Ambulatory Visit (HOSPITAL_COMMUNITY): Payer: Self-pay | Admitting: Endodontics

## 2020-02-12 MED FILL — AMOXICILLIN 500 MG CAPSULE: 500 | 7 days supply | Qty: 28 | Fill #0

## 2020-02-12 MED FILL — IBUPROFEN 600 MG TABLET: 600 | 2 days supply | Qty: 10 | Fill #0

## 2020-08-07 ENCOUNTER — Other Ambulatory Visit: Payer: Self-pay

## 2020-08-07 ENCOUNTER — Encounter (HOSPITAL_COMMUNITY): Payer: Self-pay

## 2020-08-07 ENCOUNTER — Ambulatory Visit (HOSPITAL_COMMUNITY)
Admission: EM | Admit: 2020-08-07 | Discharge: 2020-08-07 | Disposition: A | Discharge status: Home / Self Care | Payer: 59 | Attending: Family Medicine | Admitting: Family Medicine

## 2020-08-07 DIAGNOSIS — S81801A Unspecified open wound, right lower leg, initial encounter: Secondary | ICD-10-CM | POA: Diagnosis not present

## 2020-08-07 DIAGNOSIS — S81802A Unspecified open wound, left lower leg, initial encounter: Secondary | ICD-10-CM
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
## 2020-08-10 NOTE — ED Provider Notes (Signed)
  [facility]   [id number] 08/07/20 Arrival Time: 1943  ASSESSMENT & PLAN:  1. Avulsion of skin of left lower leg, initial encounter   2. Avulsion of skin of right lower leg, initial encounter    No repair needed. Discussed simple wound care and cleaning to avoid infection. OTC analgesics as needed. WBAT.  Orders Placed This Encounter  Procedures   Apply dressing    Recommend:  Follow-up Information     [facility].   Specialty: Urgent Care Why: If worsening or failing to improve as anticipated. Contact information: [address] [place] [address] [phone]                Reviewed expectations re: course of current medical issues. Questions answered. Outlined signs and symptoms indicating need for more acute intervention. Patient verbalized understanding. After Visit Summary given.  SUBJECTIVE: History from: patient. Stephen Hoffman is a 35 y.o. male who reports "cuts" just below both knees; doing jumping exercises at gym; slipped; box vs skin. Bleeding controlled. No extremity sensation changes or weakness. Ambulatory without difficulty. Feels Td is UTD.   OBJECTIVE:  Vitals:   08/07/20 2000  BP: (!) 143/84  Pulse: (!) 58  Resp: 16  Temp: 98.4 F (36.9 C)  TempSrc: Oral  SpO2: 97%    General appearance: alert; no distress HEENT: [place]; AT Neck: supple with FROM Resp: unlabored respirations Extremities: Bilateral skin avulsions just below bilateral knees; minimal bleeding; no debris or FB appreciated; FROM of both knees CV: brisk extremity capillary refill of bilateral LE; 2+ DP pulse of bilateral LE. Skin: warm and dry; no visible rashes Neurologic: gait normal; normal sensation and strength of bilateral LE Psychological: alert and cooperative; normal mood and affect    Allergies  Allergen Reactions   Pork-Derived Products Shortness Of Breath    History reviewed. No pertinent past  medical history. Social History   Socioeconomic History   Marital status: Married    Spouse name: Not on file   Number of children: Not on file   Years of education: Not on file   Highest education level: Not on file  Occupational History   Not on file  Tobacco Use   Smoking status: Former    Pack years: 0.00   Smokeless tobacco: Never  Substance and Sexual Activity   Alcohol use: No   Drug use: Yes    Types: Marijuana    Comment: 2005   Sexual activity: Not on file  Other Topics Concern   Not on file  Social History Narrative   Not on file   Social Determinants of Health   Financial Resource Strain: Not on file  Food Insecurity: Not on file  Transportation Needs: Not on file  Physical Activity: Not on file  Stress: Not on file  Social Connections: Not on file   Family History  Problem Relation Age of Onset   Diabetes Mother    Hypertension Mother    History reviewed. No pertinent surgical history.     Mardella Layman, MD 08/10/20 0900

## 2020-08-30 ENCOUNTER — Other Ambulatory Visit (HOSPITAL_COMMUNITY): Payer: Self-pay

## 2020-08-30 MED ORDER — SILVER SULFADIAZINE 1 % EX CREA
TOPICAL_CREAM | CUTANEOUS | 1 refills | Status: AC
  Filled 2020-08-30: qty 50, 15d supply, fill #0

## 2020-09-09 ENCOUNTER — Other Ambulatory Visit (HOSPITAL_COMMUNITY): Payer: Self-pay

## 2021-03-10 ENCOUNTER — Other Ambulatory Visit: Payer: Self-pay

## 2021-03-10 ENCOUNTER — Emergency Department (HOSPITAL_BASED_OUTPATIENT_CLINIC_OR_DEPARTMENT_OTHER)
Admission: EM | Admit: 2021-03-10 | Discharge: 2021-03-10 | Disposition: A | Discharge status: Home / Self Care | Payer: 59 | Attending: Emergency Medicine | Admitting: Emergency Medicine

## 2021-03-10 ENCOUNTER — Encounter (HOSPITAL_BASED_OUTPATIENT_CLINIC_OR_DEPARTMENT_OTHER): Payer: Self-pay

## 2021-03-10 DIAGNOSIS — L0889 Other specified local infections of the skin and subcutaneous tissue: Secondary | ICD-10-CM | POA: Diagnosis present

## 2021-03-10 DIAGNOSIS — L02512 Cutaneous abscess of left hand: Secondary | ICD-10-CM | POA: Diagnosis not present

## 2021-03-10 DIAGNOSIS — L0291 Cutaneous abscess, unspecified: Secondary | ICD-10-CM

## 2021-03-10 MED ORDER — DOXYCYCLINE HYCLATE 100 MG PO CAPS: 100.0000 mg | ORAL_CAPSULE | Freq: Two times a day (BID) | ORAL | 0 refills | Status: AC

## 2021-03-10 MED ORDER — MUPIROCIN 2 % EX OINT: 1.0000 "application " | TOPICAL_OINTMENT | Freq: Two times a day (BID) | CUTANEOUS | 0 refills | Status: AC

## 2021-03-10 NOTE — ED Triage Notes (Signed)
Pt c/o pain/?infection left hand-states he "popped a pimple" ~1 week ago-NAD-steady gait

## 2021-03-10 NOTE — Discharge Instructions (Signed)
Apply the Bactroban ointment both to the affected area on your hand as well as use a Q-tip to wipe the inside of both of your nasal passages twice a day. Get help right away if you: Have severe pain. See red streaks on your skin spreading away from the abscess.

## 2021-03-10 NOTE — ED Provider Notes (Signed)
[facility] EMERGENCY DEPARTMENT Provider Note   CSN: [id number] Arrival date & time: 03/10/21  1900     History  Chief Complaint  Patient presents with   Hand Pain    Stephen Hoffman is a 36 y.o. male who presents to the ED with a cc of left hand infection.  Patient states that he had a "pimple" on the left thumb yesterday it was painful.  He busted a and noticed purulent discharge.  He states it has been draining purulent discharge since that time.  He denies any fevers or chills, difficulty moving the thumb.  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   Hand Pain      Home Medications Prior to Admission medications   Medication Sig Start Date End Date Taking? Authorizing Provider  cephALEXin (KEFLEX) 500 MG capsule Take 1 capsule (500 mg total) by mouth 4 (four) times daily. 03/02/19   Darr, Gerilyn Pilgrim, PA-C  cetirizine (ZYRTEC) 10 MG tablet Take 10 mg by mouth every morning.     [provider]  Cholecalciferol 25 MCG (1000 UT) tablet Take by mouth.    [provider]  diclofenac Sodium (VOLTAREN) 1 % GEL diclofenac 1 % topical gel    [provider]  fluticasone (FLONASE) 50 MCG/ACT nasal spray 1 spray by Both Nostrils route daily.    [provider]  loperamide (IMODIUM) 2 MG capsule Take 1 capsule (2 mg total) by mouth 4 (four) times daily as needed for diarrhea or loose stools. 02/05/20   Rhys Martini, PA-C  Multiple Vitamin (MULTIVITAMIN WITH MINERALS) TABS tablet Take 1 tablet by mouth every morning.    [provider]  ondansetron (ZOFRAN) 8 MG tablet Take 1 tablet (8 mg total) by mouth every 8 (eight) hours as needed for nausea or vomiting. 02/05/20   Rhys Martini, PA-C  silver sulfADIAZINE (SILVADENE) 1 % cream Apply to affected area daily 08/30/20         Allergies    Pork-derived products    Review of Systems   Review of Systems As per the HPI Physical Exam Updated Vital Signs BP (!)  151/102 (BP Location: Left Arm)    Pulse (!) 48    Temp 98.3 F (36.8 C) (Oral)    Resp 18    Ht 6\' 2"  (1.88 m)    Wt 127.9 kg    SpO2 99%    BMI 36.21 kg/m  Physical Exam Vitals and nursing note reviewed.  Constitutional:      General: He is not in acute distress.    Appearance: He is well-developed. He is not diaphoretic.  HENT:     Head: Normocephalic and atraumatic.  Eyes:     General: No scleral icterus.    Conjunctiva/sclera: Conjunctivae normal.  Cardiovascular:     Rate and Rhythm: Normal rate and regular rhythm.     Heart sounds: Normal heart sounds.  Pulmonary:     Effort: Pulmonary effort is normal. No respiratory distress.     Breath sounds: Normal breath sounds.  Abdominal:     Palpations: Abdomen is soft.     Tenderness: There is no abdominal tenderness.  Musculoskeletal:     Cervical back: Normal range of motion and neck supple.  Skin:    General: Skin is warm and dry.     Comments: Left thumb with a circular abscess, purulent discharge, full range of motion of the thumb, no streaking redness  Neurological:  Mental Status: He is alert.  Psychiatric:        Behavior: Behavior normal.    ED Results / Procedures / Treatments   Labs (all labs ordered are listed, but only abnormal results are displayed) Labs Reviewed - No data to display  EKG None  Radiology No results found.  Procedures Procedures    Medications Ordered in ED Medications - No data to display  ED Course/ Medical Decision Making/ A&P                           Medical Decision Making Patient with draining abscess on the skin of the left thumb, no signs of deeper infection, will treat with doxycycline and topical Bactroban.  Patient vies to follow-up with PCP about his blood pressure.  Patient otherwise appropriate for discharge at this time Final Clinical Impression(s) / ED Diagnoses Final diagnoses:  None    Rx / DC Orders ED Discharge Orders     None         Arthor Captain, PA-C 03/10/21 2159    Pricilla Loveless, MD 03/11/21 [phone]

## 2021-07-06 ENCOUNTER — Other Ambulatory Visit: Payer: Self-pay

## 2021-07-06 ENCOUNTER — Emergency Department (HOSPITAL_BASED_OUTPATIENT_CLINIC_OR_DEPARTMENT_OTHER)
Admission: EM | Admit: 2021-07-06 | Discharge: 2021-07-06 | Disposition: A | Discharge status: Home / Self Care | Payer: 59 | Attending: Emergency Medicine | Admitting: Emergency Medicine

## 2021-07-06 ENCOUNTER — Encounter (HOSPITAL_BASED_OUTPATIENT_CLINIC_OR_DEPARTMENT_OTHER): Payer: Self-pay | Admitting: Emergency Medicine

## 2021-07-06 DIAGNOSIS — J069 Acute upper respiratory infection, unspecified: Secondary | ICD-10-CM

## 2021-07-06 DIAGNOSIS — R0981 Nasal congestion: Secondary | ICD-10-CM | POA: Insufficient documentation

## 2021-07-06 DIAGNOSIS — Z20822 Contact with and (suspected) exposure to covid-19: Secondary | ICD-10-CM | POA: Diagnosis not present

## 2021-07-06 DIAGNOSIS — J029 Acute pharyngitis, unspecified: Secondary | ICD-10-CM | POA: Insufficient documentation

## 2021-07-06 LAB — SARS CORONAVIRUS 2 BY RT PCR: SARS Coronavirus 2 by RT PCR: NEGATIVE

## 2021-07-06 MED ORDER — ACETAMINOPHEN 500 MG PO TABS
1000.0000 mg | ORAL_TABLET | Freq: Once | ORAL | Status: AC
  Administered 2021-07-06: 1000 mg via ORAL
  Filled 2021-07-06: qty 2

## 2021-07-06 NOTE — ED Triage Notes (Signed)
Sore throat that started this morning with a cough for the last few weeks.  Both children have similar symptoms.

## 2021-07-06 NOTE — ED Notes (Signed)
Sore throat started this am, cough > week, no fever

## 2021-07-06 NOTE — ED Provider Notes (Addendum)
[facility] EMERGENCY DEPARTMENT Provider Note   CSN: [id number] Arrival date & time: 07/06/21  2219     History  Chief Complaint  Patient presents with   Sore Throat    Stephen Hoffman is a 36 y.o. male.  The history is provided by the patient.  Sore Throat This is a new problem. The current episode started 12 to 24 hours ago. The problem occurs constantly. The problem has not changed since onset.Pertinent negatives include no chest pain, no abdominal pain, no headaches and no shortness of breath. Nothing aggravates the symptoms. Nothing relieves the symptoms. He has tried nothing for the symptoms. The treatment provided no relief.  With congestion.  Daughters with URI and also here as patients.      Home Medications Prior to Admission medications   Medication Sig Start Date End Date Taking? Authorizing Provider  cephALEXin (KEFLEX) 500 MG capsule Take 1 capsule (500 mg total) by mouth 4 (four) times daily. 03/02/19   Darr, Gerilyn Pilgrim, PA-C  cetirizine (ZYRTEC) 10 MG tablet Take 10 mg by mouth every morning.     [provider]  Cholecalciferol 25 MCG (1000 UT) tablet Take by mouth.    [provider]  diclofenac Sodium (VOLTAREN) 1 % GEL diclofenac 1 % topical gel    [provider]  doxycycline (VIBRAMYCIN) 100 MG capsule Take 1 capsule (100 mg total) by mouth 2 (two) times daily. One po bid x 7 days 03/10/21   Arthor Captain, PA-C  fluticasone [facility]) 50 MCG/ACT nasal spray 1 spray by Both Nostrils route daily.    [provider]  loperamide (IMODIUM) 2 MG capsule Take 1 capsule (2 mg total) by mouth 4 (four) times daily as needed for diarrhea or loose stools. 02/05/20   Rhys Martini, PA-C  Multiple Vitamin (MULTIVITAMIN WITH MINERALS) TABS tablet Take 1 tablet by mouth every morning.    [provider]  mupirocin ointment (BACTROBAN) 2 % Apply 1 application topically 2 (two) times daily. 03/10/21   Harris, Abigail, PA-C   ondansetron (ZOFRAN) 8 MG tablet Take 1 tablet (8 mg total) by mouth every 8 (eight) hours as needed for nausea or vomiting. 02/05/20   Rhys Martini, PA-C  silver sulfADIAZINE (SILVADENE) 1 % cream Apply to affected area daily 08/30/20         Allergies    Pork-derived products    Review of Systems   Review of Systems  Constitutional:  Negative for fever.  HENT:  Positive for congestion and sore throat.   Respiratory:  Negative for shortness of breath.   Cardiovascular:  Negative for chest pain.  Gastrointestinal:  Negative for abdominal pain.  Neurological:  Negative for headaches.  All other systems reviewed and are negative.  Physical Exam Updated Vital Signs BP (!) 142/91 (BP Location: Left Arm)   Pulse (!) 58   Temp 98.2 F (36.8 C) (Oral)   Resp 18   Ht 6\' 2"  (1.88 m)   Wt 122.5 kg   SpO2 98%   BMI 34.67 kg/m  Physical Exam Vitals and nursing note reviewed.  Constitutional:      General: He is not in acute distress.    Appearance: Normal appearance.  HENT:     Head: Normocephalic and atraumatic.     Nose: Nose normal.     Mouth/Throat:     Mouth: Mucous membranes are moist.     Pharynx: Oropharynx is clear. No oropharyngeal exudate.  Eyes:  Conjunctiva/sclera: Conjunctivae normal.     Pupils: Pupils are equal, round, and reactive to light.  Neck:     Comments: Intact phonation, no pain with displacement of the trachea  Cardiovascular:     Rate and Rhythm: Normal rate and regular rhythm.     Pulses: Normal pulses.     Heart sounds: Normal heart sounds.  Pulmonary:     Effort: Pulmonary effort is normal.     Breath sounds: Normal breath sounds.  Abdominal:     General: Abdomen is flat. Bowel sounds are normal.     Palpations: Abdomen is soft.     Tenderness: There is no abdominal tenderness. There is no guarding.  Musculoskeletal:        General: Normal range of motion.     Cervical back: Normal range of motion and neck supple. No rigidity or  tenderness.  Skin:    General: Skin is warm and dry.     Capillary Refill: Capillary refill takes less than 2 seconds.  Neurological:     General: No focal deficit present.     Mental Status: He is alert and oriented to person, place, and time.     Deep Tendon Reflexes: Reflexes normal.  Psychiatric:        Mood and Affect: Mood normal.        Behavior: Behavior normal.    ED Results / Procedures / Treatments   Labs (all labs ordered are listed, but only abnormal results are displayed) Labs Reviewed  SARS CORONAVIRUS 2 BY RT PCR    EKG None  Radiology No results found.  Procedures Procedures    Medications Ordered in ED Medications - No data to display  ED Course/ Medical Decision Making/ A&P                           Medical Decision Making URI symptoms.  Family is all here as patients   Amount and/or Complexity of Data Reviewed External Data Reviewed: notes.    Details: previous notes reviewed Labs: ordered.    Details: negative covid  Risk OTC drugs. Risk Details: Based on the CENTOR score there is no indication for further testing or treatment.  Vira like daughter's illnesses. No signs of epiglottitis or deep tissue infection.  Alternate tylenol and ibuprofen.      Final Clinical Impression(s) / ED Diagnoses Final diagnoses:  None   Return for intractable cough, coughing up blood, fevers > 100.4 unrelieved by medication, shortness of breath, intractable vomiting, chest pain, shortness of breath, weakness, numbness, changes in speech, facial asymmetry, abdominal pain, passing out, Inability to tolerate liquids or food, cough, altered mental status or any concerns. No signs of systemic illness or infection. The patient is nontoxic-appearing on exam and vital signs are within normal limits.  I have reviewed the triage vital signs and the nursing notes. Pertinent labs & imaging results that were available during my care of the patient were reviewed by me and  considered in my medical decision making (see chart for details). After history, exam, and medical workup I feel the patient has been appropriately medically screened and is safe for discharge home. Pertinent diagnoses were discussed with the patient. Patient was given return precautions.        Kawanda Drumheller, MD 07/07/21 6578

## 2023-09-06 ENCOUNTER — Encounter (HOSPITAL_BASED_OUTPATIENT_CLINIC_OR_DEPARTMENT_OTHER): Payer: Self-pay

## 2023-09-06 ENCOUNTER — Emergency Department (HOSPITAL_BASED_OUTPATIENT_CLINIC_OR_DEPARTMENT_OTHER)
Admission: EM | Admit: 2023-09-06 | Discharge: 2023-09-07 | Disposition: A | Discharge status: Home / Self Care | Attending: Emergency Medicine | Admitting: Emergency Medicine

## 2023-09-06 DIAGNOSIS — R519 Headache, unspecified: Secondary | ICD-10-CM | POA: Insufficient documentation

## 2023-09-06 LAB — CBC WITH DIFFERENTIAL/PLATELET
Abs Immature Granulocytes: 0.01 K/uL (ref 0.00–0.07)
Basophils Absolute: 0 K/uL (ref 0.0–0.1)
Basophils Relative: 1 %
Eosinophils Absolute: 0.2 K/uL (ref 0.0–0.5)
Eosinophils Relative: 4 %
HCT: 42.8 % (ref 39.0–52.0)
Hemoglobin: 14.4 g/dL (ref 13.0–17.0)
Immature Granulocytes: 0 %
Lymphocytes Relative: 40 %
Lymphs Abs: 2.1 K/uL (ref 0.7–4.0)
MCH: 27.2 pg (ref 26.0–34.0)
MCHC: 33.6 g/dL (ref 30.0–36.0)
MCV: 80.9 fL (ref 80.0–100.0)
Monocytes Absolute: 0.6 K/uL (ref 0.1–1.0)
Monocytes Relative: 11 %
Neutro Abs: 2.3 K/uL (ref 1.7–7.7)
Neutrophils Relative %: 44 %
Platelets: 177 K/uL (ref 150–400)
RBC: 5.29 MIL/uL (ref 4.22–5.81)
RDW: 12.8 % (ref 11.5–15.5)
WBC: 5.2 K/uL (ref 4.0–10.5)
nRBC: 0 % (ref 0.0–0.2)

## 2023-09-06 LAB — COMPREHENSIVE METABOLIC PANEL WITH GFR
ALT: 39 U/L (ref 0–44)
AST: 35 U/L (ref 15–41)
Albumin: 4.2 g/dL (ref 3.5–5.0)
Alkaline Phosphatase: 71 U/L (ref 38–126)
Anion gap: 12 (ref 5–15)
BUN: 14 mg/dL (ref 6–20)
CO2: 24 mmol/L (ref 22–32)
Calcium: 9.3 mg/dL (ref 8.9–10.3)
Chloride: 102 mmol/L (ref 98–111)
Creatinine, Ser: 1.02 mg/dL (ref 0.61–1.24)
GFR, Estimated: >60 mL/min (ref >60)
Glucose, Bld: 100 mg/dL — ABNORMAL HIGH (ref 70–99)
Potassium: 3.9 mmol/L (ref 3.5–5.1)
Sodium: 138 mmol/L (ref 135–145)
Total Bilirubin: 0.3 mg/dL (ref 0.0–1.2)
Total Protein: 7 g/dL (ref 6.5–8.1)

## 2023-09-06 LAB — URINALYSIS, W/ REFLEX TO CULTURE (INFECTION SUSPECTED)
Bacteria, UA: NONE SEEN
Bilirubin Urine: NEGATIVE
Glucose, UA: NEGATIVE mg/dL
Hgb urine dipstick: NEGATIVE
Ketones, ur: NEGATIVE mg/dL
Leukocytes,Ua: NEGATIVE
Nitrite: NEGATIVE
Protein, ur: NEGATIVE mg/dL
Specific Gravity, Urine: 1.022 (ref 1.005–1.030)
pH: 6 (ref 5.0–8.0)

## 2023-09-06 LAB — CK: Total CK: 585 U/L — ABNORMAL HIGH (ref 49–397)

## 2023-09-06 MED ORDER — KETOROLAC TROMETHAMINE 15 MG/ML IJ SOLN
15.0000 mg | Freq: Once | INTRAMUSCULAR | Status: AC
  Administered 2023-09-06: 15 mg via INTRAVENOUS
  Filled 2023-09-06: qty 1

## 2023-09-06 MED ORDER — DIPHENHYDRAMINE HCL 50 MG/ML IJ SOLN
12.5000 mg | Freq: Once | INTRAMUSCULAR | Status: AC
  Administered 2023-09-06: 12.5 mg via INTRAVENOUS
  Filled 2023-09-06: qty 1

## 2023-09-06 MED ORDER — PROCHLORPERAZINE EDISYLATE 10 MG/2ML IJ SOLN
10.0000 mg | Freq: Once | INTRAMUSCULAR | Status: AC
  Administered 2023-09-06: 10 mg via INTRAVENOUS
  Filled 2023-09-06: qty 2

## 2023-09-06 MED ORDER — SODIUM CHLORIDE 0.9 % IV BOLUS
1000.0000 mL | Freq: Once | INTRAVENOUS | Status: AC
  Administered 2023-09-06: 1000 mL via INTRAVENOUS

## 2023-09-06 NOTE — ED Triage Notes (Signed)
 Pt states he has had a HA since Saturday, today worse  Denies N/V Does feel lightheaded

## 2023-09-06 NOTE — Discharge Instructions (Signed)
 It was a pleasure taking care of you here today  Make sure to rest and hydrate.  Tylenol  1000 mg every 6 hours. Do not take more that 4000 mg in a 24 hour period.  Ibuprofen  800 mg every 8 hours, do not take more than 2400 mg in a 24 hour period.   Follow-up outpatient, return for any worsening symptoms

## 2023-09-06 NOTE — ED Provider Notes (Signed)
 [place] EMERGENCY DEPARTMENT AT [facility] Provider Note   CSN: [id number] Arrival date & time: 09/06/23  2107    Patient presents with: Headache   Stephen Hoffman is a 38 y.o. male here for evaluation of headache.  Started on Saturday.  He has not taken anything for his symptoms.  Headache across frontal head.  No vision changes, slurred speech, numbness, weakness, nausea, vomiting, abdominal pain, chest pain.  No known head trauma.  States he does work out in the heat.  Symptoms worsened today due to being on the heat all day working.  He feels dehydrated.  No history of clotting disorder, no sudden onset thunderclap headache.  No pain to eyes.   HPI     Prior to Admission medications   Medication Sig Start Date End Date Taking? Authorizing Provider  cephALEXin  (KEFLEX ) 500 MG capsule Take 1 capsule (500 mg total) by mouth 4 (four) times daily. 03/02/19   Darr, Jacob, PA-C  cetirizine (ZYRTEC) 10 MG tablet Take 10 mg by mouth every morning.     [provider]  Cholecalciferol 25 MCG (1000 UT) tablet Take by mouth.    [provider]  diclofenac Sodium (VOLTAREN) 1 % GEL diclofenac 1 % topical gel    [provider]  doxycycline  (VIBRAMYCIN ) 100 MG capsule Take 1 capsule (100 mg total) by mouth 2 (two) times daily. One po bid x 7 days 03/10/21   Harris, Abigail, PA-C  fluticasone (FLONASE) 50 MCG/ACT nasal spray 1 spray by Both Nostrils route daily.    [provider]  loperamide  (IMODIUM ) 2 MG capsule Take 1 capsule (2 mg total) by mouth 4 (four) times daily as needed for diarrhea or loose stools. 02/05/20   Graham, Laura E, PA-C  Multiple Vitamin (MULTIVITAMIN WITH MINERALS) TABS tablet Take 1 tablet by mouth every morning.    [provider]  mupirocin  ointment (BACTROBAN ) 2 % Apply 1 application topically 2 (two) times daily. 03/10/21   Harris, Abigail, PA-C  ondansetron  (ZOFRAN ) 8 MG tablet Take 1 tablet (8 mg total) by mouth  every 8 (eight) hours as needed for nausea or vomiting. 02/05/20   Graham, Laura E, PA-C  silver  sulfADIAZINE  (SILVADENE ) 1 % cream Apply to affected area daily 08/30/20       Allergies: Pork-derived products    Review of Systems  Constitutional: Negative.   HENT: Negative.    Respiratory: Negative.    Cardiovascular: Negative.   Gastrointestinal: Negative.   Genitourinary: Negative.   Musculoskeletal: Negative.   Skin: Negative.   Neurological:  Positive for headaches. Negative for dizziness, tremors, seizures, syncope, facial asymmetry, speech difficulty, weakness, light-headedness and numbness.  All other systems reviewed and are negative.   Updated Vital Signs BP (!) 143/94 (BP Location: Left Arm)   Pulse (!) 50   Temp 97.8 F (36.6 C) (Oral)   Resp 15   Ht 6' 2 (1.88 m)   Wt 115.7 kg   SpO2 98%   BMI 32.74 kg/m   Physical Exam Physical Exam  Constitutional: Pt is oriented to person, place, and time. Pt appears well-developed and well-nourished. No distress.  HENT:  Head: Normocephalic and atraumatic.  Mouth/Throat: Oropharynx is clear and moist.  Eyes: Conjunctivae and EOM are normal. Pupils are equal, round, and reactive to light. No scleral icterus.  No horizontal, vertical or rotational nystagmus  Neck: Normal range of motion. Neck supple.  Full active and passive ROM without pain No midline or paraspinal tenderness No  nuchal rigidity or meningeal signs  Cardiovascular: Normal rate, regular rhythm and intact distal pulses.   Pulmonary/Chest: Effort normal and breath sounds normal. No respiratory distress. Pt has no wheezes. No rales.  Abdominal: Soft. Bowel sounds are normal. There is no tenderness. There is no rebound and no guarding.  Musculoskeletal: Normal range of motion.  Lymphadenopathy:    No cervical adenopathy.  Neurological: Pt. is alert and oriented to person, place, and time. He has normal reflexes. No cranial nerve deficit.  Exhibits normal  muscle tone. Coordination normal.  Mental Status:  Alert, oriented, thought content appropriate. Speech fluent without evidence of aphasia. Able to follow 2 step commands without difficulty.  Cranial Nerves:  2-12 grossly intact Motor:  Equal strength BIL Sensory:intact sensation Cerebellar: normal F2N Gait: normal gait and balance CV: distal pulses palpable throughout   Skin: Skin is warm and dry. No rash noted. Pt is not diaphoretic.  Psychiatric: Pt has a normal mood and affect. Behavior is normal. Judgment and thought content normal.  Nursing note and vitals reviewed.  (all labs ordered are listed, but only abnormal results are displayed) Labs Reviewed  CBC WITH DIFFERENTIAL/PLATELET  URINALYSIS, W/ REFLEX TO CULTURE (INFECTION SUSPECTED)  COMPREHENSIVE METABOLIC PANEL WITH GFR  CK    EKG: None  Radiology: No results found.   Procedures   Medications Ordered in the ED  sodium chloride  0.9 % bolus 1,000 mL (1,000 mLs Intravenous New Bag/Given 09/06/23 2259)  prochlorperazine  (COMPAZINE ) injection 10 mg (10 mg Intravenous Given 09/06/23 2301)  ketorolac  (TORADOL ) 15 MG/ML injection 15 mg (15 mg Intravenous Given 09/06/23 2301)  diphenhydrAMINE  (BENADRYL ) injection 12.5 mg (12.5 mg Intravenous Given 09/06/23 3949)   38 year old here for evaluation of headache which started on Saturday, initially had improved however worked all day out in the heat today and headache worsened.  He has not taken anything for symptoms.  Has a nonfocal neuroexam.  Denies any sudden onset thunderclap headache.  Given significant heat exposure today.  Will treat migraine cocktail, IV fluids.  Will get labs to check for rhabdomyolysis.  Will hold on imaging currently as he has a nonfocal neuroexam.  Labs personally viewed and interpreted:   CBC without leukocytosis CMP pending UA neg for  infection, blood CK pending EKG without ischemic changes  Care transferred to Dr. Jerrol who will FU on  remaining labs and dispo.                                     Medical Decision Making Amount and/or Complexity of Data Reviewed External Data Reviewed: labs, radiology and notes. Labs: ordered. Decision-making details documented in ED Course.  Risk OTC drugs. Prescription drug management. Decision regarding hospitalization. Diagnosis or treatment significantly limited by social determinants of health.        Final diagnoses:  Acute nonintractable headache, unspecified headache type    ED Discharge Orders     None          Deakin Lacek A, PA-C 09/06/23 2351    Dreama Longs, MD 09/07/23 1039

## 2023-09-06 NOTE — ED Provider Notes (Signed)
  Physical Exam  BP (!) 143/94 (BP Location: Left Arm)   Pulse (!) 50   Temp 97.8 F (36.6 C) (Oral)   Resp 15   Ht 6' 2 (1.88 m)   Wt 115.7 kg   SpO2 98%   BMI 32.74 kg/m   Physical Exam  Procedures  Procedures  ED Course / MDM    Medical Decision Making Amount and/or Complexity of Data Reviewed Labs: ordered.  Risk Prescription drug management.   89M presenting with headache over the weekend, was working outside, HA got better with a migraine cocktail, waiting on screening labs. No fever, neck pain, tick bite or exposure. Plan for DC.   Pt reassessed, feels completely better, headache free. Labs reassuring, CK mildly elevated but explained by patient's exertional activity over the weekend. No AKI, well appearing, given an IVF bolus.  Stable for DC and outpatient follow-up.    Jerrol Agent, MD 09/07/23 STAN[place]
